# Patient Record
Sex: Female | Born: 1990 | Race: Black or African American | Hispanic: No | Marital: Single | Smoking: Never smoker
Health system: Southern US, Community
[De-identification: ages and names within clinical notes are randomized; demographics above are authoritative.]

## PROBLEM LIST (undated history)

## (undated) ENCOUNTER — Inpatient Hospital Stay (HOSPITAL_COMMUNITY): Payer: Self-pay

## (undated) DIAGNOSIS — Z8759 Personal history of other complications of pregnancy, childbirth and the puerperium: Secondary | ICD-10-CM

## (undated) DIAGNOSIS — A749 Chlamydial infection, unspecified: Secondary | ICD-10-CM

## (undated) HISTORY — PX: BREAST SURGERY: SHX581

## (undated) HISTORY — PX: TONSILLECTOMY: SUR1361

---
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
## 2010-08-19 ENCOUNTER — Encounter: Payer: Self-pay | Admitting: Family Medicine

## 2010-08-19 ENCOUNTER — Inpatient Hospital Stay (HOSPITAL_COMMUNITY)
Admission: AD | Admit: 2010-08-19 | Discharge: 2010-08-19 | Payer: Self-pay | Source: Home / Self Care | Admitting: Family Medicine

## 2010-08-19 ENCOUNTER — Ambulatory Visit: Payer: Self-pay | Admitting: Family

## 2010-08-21 ENCOUNTER — Inpatient Hospital Stay (HOSPITAL_COMMUNITY)
Admission: AD | Admit: 2010-08-21 | Discharge: 2010-08-21 | Payer: Self-pay | Source: Home / Self Care | Admitting: Family Medicine

## 2010-08-28 ENCOUNTER — Ambulatory Visit (HOSPITAL_COMMUNITY)
Admission: RE | Admit: 2010-08-28 | Discharge: 2010-08-28 | Payer: Self-pay | Source: Home / Self Care | Admitting: Obstetrics and Gynecology

## 2010-08-28 ENCOUNTER — Ambulatory Visit: Payer: Self-pay | Admitting: Nurse Practitioner

## 2010-12-03 ENCOUNTER — Inpatient Hospital Stay (HOSPITAL_COMMUNITY)
Admission: AD | Admit: 2010-12-03 | Discharge: 2010-12-03 | Disposition: A | Discharge status: Home / Self Care | Payer: Medicaid Other | Source: Ambulatory Visit | Attending: Obstetrics and Gynecology | Admitting: Obstetrics and Gynecology

## 2010-12-03 DIAGNOSIS — R109 Unspecified abdominal pain: Secondary | ICD-10-CM | POA: Insufficient documentation

## 2010-12-03 DIAGNOSIS — B9689 Other specified bacterial agents as the cause of diseases classified elsewhere: Secondary | ICD-10-CM | POA: Insufficient documentation

## 2010-12-03 DIAGNOSIS — A499 Bacterial infection, unspecified: Secondary | ICD-10-CM

## 2010-12-03 DIAGNOSIS — N760 Acute vaginitis: Secondary | ICD-10-CM | POA: Insufficient documentation

## 2010-12-03 LAB — URINALYSIS, ROUTINE W REFLEX MICROSCOPIC
Hgb urine dipstick: NEGATIVE
Ketones, ur: NEGATIVE mg/dL
Protein, ur: NEGATIVE mg/dL
Urobilinogen, UA: 0.2 mg/dL (ref 0.0–1.0)

## 2010-12-03 LAB — HCG, QUANTITATIVE, PREGNANCY: hCG, Beta Chain, Quant, S: <2 m[IU]/mL (ref <5)

## 2010-12-03 LAB — WET PREP, GENITAL: Yeast Wet Prep HPF POC: NONE SEEN

## 2011-01-10 LAB — URINALYSIS, ROUTINE W REFLEX MICROSCOPIC
Leukocytes, UA: NEGATIVE
Nitrite: NEGATIVE
Specific Gravity, Urine: 1.01 (ref 1.005–1.030)
pH: 6.5 (ref 5.0–8.0)

## 2011-01-10 LAB — URINE MICROSCOPIC-ADD ON

## 2011-01-10 LAB — HCG, QUANTITATIVE, PREGNANCY
hCG, Beta Chain, Quant, S: 110 m[IU]/mL — ABNORMAL HIGH (ref <5)
hCG, Beta Chain, Quant, S: 557 m[IU]/mL — ABNORMAL HIGH (ref <5)

## 2011-01-10 LAB — CBC
MCH: 31.6 pg (ref 26.0–34.0)
MCHC: 33.9 g/dL (ref 30.0–36.0)
MCV: 93.2 fL (ref 78.0–100.0)
Platelets: 240 10*3/uL (ref 150–400)

## 2011-01-10 LAB — WET PREP, GENITAL: WBC, Wet Prep HPF POC: NONE SEEN

## 2011-02-12 ENCOUNTER — Inpatient Hospital Stay (HOSPITAL_COMMUNITY)
Admission: AD | Admit: 2011-02-12 | Discharge: 2011-02-12 | Disposition: A | Discharge status: Home / Self Care | Payer: Medicaid Other | Source: Ambulatory Visit | Attending: Obstetrics & Gynecology | Admitting: Obstetrics & Gynecology

## 2011-02-12 DIAGNOSIS — N912 Amenorrhea, unspecified: Secondary | ICD-10-CM

## 2011-02-12 DIAGNOSIS — Z3202 Encounter for pregnancy test, result negative: Secondary | ICD-10-CM | POA: Insufficient documentation

## 2011-02-12 LAB — POCT PREGNANCY, URINE: Preg Test, Ur: NEGATIVE

## 2011-12-08 ENCOUNTER — Inpatient Hospital Stay (HOSPITAL_COMMUNITY)
Admission: AD | Admit: 2011-12-08 | Discharge: 2011-12-08 | Disposition: A | Discharge status: Home / Self Care | Payer: Self-pay | Source: Ambulatory Visit | Attending: Obstetrics & Gynecology | Admitting: Obstetrics & Gynecology

## 2011-12-08 ENCOUNTER — Encounter (HOSPITAL_COMMUNITY): Payer: Self-pay

## 2011-12-08 DIAGNOSIS — R109 Unspecified abdominal pain: Secondary | ICD-10-CM | POA: Insufficient documentation

## 2011-12-08 DIAGNOSIS — A499 Bacterial infection, unspecified: Secondary | ICD-10-CM | POA: Insufficient documentation

## 2011-12-08 DIAGNOSIS — Z3202 Encounter for pregnancy test, result negative: Secondary | ICD-10-CM | POA: Insufficient documentation

## 2011-12-08 DIAGNOSIS — B9689 Other specified bacterial agents as the cause of diseases classified elsewhere: Secondary | ICD-10-CM | POA: Insufficient documentation

## 2011-12-08 DIAGNOSIS — N760 Acute vaginitis: Secondary | ICD-10-CM | POA: Insufficient documentation

## 2011-12-08 LAB — CBC
HCT: 35.1 % — ABNORMAL LOW (ref 36.0–46.0)
MCHC: 33.6 g/dL (ref 30.0–36.0)
RDW: 11.9 % (ref 11.5–15.5)
WBC: 5 10*3/uL (ref 4.0–10.5)

## 2011-12-08 LAB — URINALYSIS, ROUTINE W REFLEX MICROSCOPIC
Bilirubin Urine: NEGATIVE
Glucose, UA: NEGATIVE mg/dL
Ketones, ur: NEGATIVE mg/dL
Leukocytes, UA: NEGATIVE
Nitrite: NEGATIVE
Protein, ur: NEGATIVE mg/dL

## 2011-12-08 LAB — WET PREP, GENITAL: Trich, Wet Prep: NONE SEEN

## 2011-12-08 MED ORDER — NAPROXEN SODIUM 550 MG PO TABS: 550.0000 mg | ORAL_TABLET | Freq: Two times a day (BID) | ORAL | Status: DC

## 2011-12-08 MED ORDER — METRONIDAZOLE 500 MG PO TABS: 500.0000 mg | ORAL_TABLET | Freq: Two times a day (BID) | ORAL | Status: AC

## 2011-12-08 NOTE — Progress Notes (Signed)
Pt states took 3 pregnancy tests yesterday, one positive, 2 negative. Here for bilateral lower abdominal pain, notes white discharge, hx BV. Denies bleeding.

## 2011-12-08 NOTE — Progress Notes (Signed)
Onset of abdominal pain since yesterday at work two negative and one positive UPT, pain is lower abdominal pain bilaterally , LMP 11/02/11 patient unsure if that was an actual period or a miscarriage.

## 2011-12-08 NOTE — ED Provider Notes (Signed)
History   Pt presents today wishing to have a preg test. She states she had bleeding for only 1 day last month and she was concerned that she could have an ectopic preg. She states she had one positive preg test at home followed by 2 negative home preg tests. She states she has been having some lower abd cramping with mild vag dc. She denies fever or any other sx.   Chief Complaint  Patient presents with  . Abdominal Pain   HPI  OB History    Grav Para Term Preterm Abortions TAB SAB Ect Mult Living   1    1  1          Past Medical History  Diagnosis Date  . No pertinent past medical history     Past Surgical History  Procedure Date  . Tonsillectomy     Family History  Problem Relation Age of Onset  . Anesthesia problems Neg Hx     History  Substance Use Topics  . Smoking status: Never Smoker   . Smokeless tobacco: Never Used  . Alcohol Use: 0.6 oz/week    1 Glasses of wine per week    Allergies: No Known Allergies  No prescriptions prior to admission    Review of Systems  Constitutional: Negative for fever and chills.  Eyes: Negative for blurred vision and double vision.  Respiratory: Negative for cough, hemoptysis, sputum production, shortness of breath and wheezing.   Cardiovascular: Negative for chest pain and palpitations.  Gastrointestinal: Positive for abdominal pain. Negative for nausea, vomiting, diarrhea and constipation.  Genitourinary: Negative for dysuria, urgency, frequency and hematuria.  Neurological: Negative for dizziness and headaches.  Psychiatric/Behavioral: Negative for depression and suicidal ideas.   Physical Exam   Blood pressure 109/68, pulse 81, temperature 98.6 F (37 C), temperature source Oral, resp. rate 16, height 5\' 6"  (1.676 m), weight 124 lb (56.246 kg), last menstrual period 11/02/2011.  Physical Exam  Nursing note and vitals reviewed. Constitutional: She is oriented to person, place, and time. She appears well-developed  and well-nourished. No distress.  HENT:  Head: Normocephalic and atraumatic.  Eyes: EOM are normal. Pupils are equal, round, and reactive to light.  GI: Soft. She exhibits no distension and no mass. There is no tenderness. There is no rebound and no guarding.  Genitourinary: No bleeding around the vagina. Vaginal discharge found.       Cervix Lg/closed. Uterus NL size and shape. No adnexal masses. Pt non-tender on exam.  Neurological: She is alert and oriented to person, place, and time.  Skin: Skin is warm and dry. She is not diaphoretic.  Psychiatric: She has a normal mood and affect. Her behavior is normal. Judgment and thought content normal.    MAU Course  Procedures  Wet prep and GC/Chlamydia cultures done.  Results for orders placed during the hospital encounter of 12/08/11 (from the past 24 hour(s))  URINALYSIS, ROUTINE W REFLEX MICROSCOPIC     Status: Abnormal   Collection Time   12/08/11 10:45 AM      Component Value Range   Color, Urine YELLOW  YELLOW    APPearance HAZY (*) CLEAR    Specific Gravity, Urine >1.030 (*) 1.005 - 1.030    pH 6.0  5.0 - 8.0    Glucose, UA NEGATIVE  NEGATIVE (mg/dL)   Hgb urine dipstick NEGATIVE  NEGATIVE    Bilirubin Urine NEGATIVE  NEGATIVE    Ketones, ur NEGATIVE  NEGATIVE (mg/dL)   Protein, ur  NEGATIVE  NEGATIVE (mg/dL)   Urobilinogen, UA 2.0 (*) 0.0 - 1.0 (mg/dL)   Nitrite NEGATIVE  NEGATIVE    Leukocytes, UA NEGATIVE  NEGATIVE   POCT PREGNANCY, URINE     Status: Normal   Collection Time   12/08/11 10:48 AM      Component Value Range   Preg Test, Ur NEGATIVE  NEGATIVE   CBC     Status: Abnormal   Collection Time   12/08/11 11:10 AM      Component Value Range   WBC 5.0  4.0 - 10.5 (K/uL)   RBC 3.84 (*) 3.87 - 5.11 (MIL/uL)   Hemoglobin 11.8 (*) 12.0 - 15.0 (g/dL)   HCT 16.1 (*) 09.6 - 46.0 (%)   MCV 91.4  78.0 - 100.0 (fL)   MCH 30.7  26.0 - 34.0 (pg)   MCHC 33.6  30.0 - 36.0 (g/dL)   RDW 04.5  40.9 - 81.1 (%)   Platelets 263  150  - 400 (K/uL)  WET PREP, GENITAL     Status: Abnormal   Collection Time   12/08/11 11:25 AM      Component Value Range   Yeast Wet Prep HPF POC NONE SEEN  NONE SEEN    Trich, Wet Prep NONE SEEN  NONE SEEN    Clue Cells Wet Prep HPF POC MANY (*) NONE SEEN    WBC, Wet Prep HPF POC FEW (*) NONE SEEN      Assessment and Plan  Abd pain: discussed with pt at length. No evidence of UTI, preg, or ovarian cyst. Will give Rx for anaprox ds. She will f/u with her PCP.  BV: discussed with pt at length. Will give Rx for Flagyl. Warned of antabuse reaction. Discussed diet, activity, risks, and precautions.  Clinton Gallant. Rayni Nemitz III, DrHSc, MPAS, PA-C  12/08/2011, 11:18 AM   Henrietta Hoover, PA 12/08/11 1151

## 2011-12-11 LAB — GC/CHLAMYDIA PROBE AMP, GENITAL
Chlamydia, DNA Probe: POSITIVE — AB
GC Probe Amp, Genital: NEGATIVE

## 2012-01-11 ENCOUNTER — Telehealth (HOSPITAL_COMMUNITY): Payer: Self-pay

## 2012-06-12 ENCOUNTER — Encounter (HOSPITAL_COMMUNITY): Payer: Self-pay | Admitting: *Deleted

## 2012-06-12 ENCOUNTER — Inpatient Hospital Stay (HOSPITAL_COMMUNITY)
Admission: AD | Admit: 2012-06-12 | Discharge: 2012-06-12 | Disposition: A | Discharge status: Home / Self Care | Payer: Medicaid Other | Source: Ambulatory Visit | Attending: Obstetrics & Gynecology | Admitting: Obstetrics & Gynecology

## 2012-06-12 ENCOUNTER — Inpatient Hospital Stay (HOSPITAL_COMMUNITY): Payer: Medicaid Other

## 2012-06-12 DIAGNOSIS — O99891 Other specified diseases and conditions complicating pregnancy: Secondary | ICD-10-CM | POA: Insufficient documentation

## 2012-06-12 DIAGNOSIS — R109 Unspecified abdominal pain: Secondary | ICD-10-CM | POA: Insufficient documentation

## 2012-06-12 DIAGNOSIS — Z3201 Encounter for pregnancy test, result positive: Secondary | ICD-10-CM

## 2012-06-12 HISTORY — DX: Personal history of other complications of pregnancy, childbirth and the puerperium: Z87.59

## 2012-06-12 LAB — CBC
Hemoglobin: 12 g/dL (ref 12.0–15.0)
MCH: 30.5 pg (ref 26.0–34.0)
MCHC: 33.8 g/dL (ref 30.0–36.0)
MCV: 90.3 fL (ref 78.0–100.0)
RBC: 3.93 MIL/uL (ref 3.87–5.11)

## 2012-06-12 LAB — WET PREP, GENITAL: Yeast Wet Prep HPF POC: NONE SEEN

## 2012-06-12 LAB — URINALYSIS, ROUTINE W REFLEX MICROSCOPIC
Bilirubin Urine: NEGATIVE
Glucose, UA: NEGATIVE mg/dL
Ketones, ur: NEGATIVE mg/dL
Leukocytes, UA: NEGATIVE
Nitrite: NEGATIVE
Protein, ur: NEGATIVE mg/dL
pH: 6 (ref 5.0–8.0)

## 2012-06-12 NOTE — MAU Provider Note (Signed)
History     CSN: [id number]  Arrival date and time: 06/12/12 1453   None     No chief complaint on file.  HPI Patient is a G3P0020 at 5.4 EGA who presents for low abdominal cramping and pregnancy test.  States this started one week ago.  Lasts for 10 minutes and comes out of the blue.  Denies bleeding and discharge. No nausea or vomiting.  States she has had some issues with constipation where she only has a BM every few days.  No changes in urination.  OB History    Grav Para Term Preterm Abortions TAB SAB Ect Mult Living   3    2  2          Past Medical History  Diagnosis Date  . History of miscarriage     Past Surgical History  Procedure Date  . Tonsillectomy     Family History  Problem Relation Age of Onset  . Anesthesia problems Neg Hx   . Other Neg Hx     History  Substance Use Topics  . Smoking status: Never Smoker   . Smokeless tobacco: Never Used  . Alcohol Use: No    Allergies: No Known Allergies  Prescriptions prior to admission  Medication Sig Dispense Refill  . ibuprofen (ADVIL,MOTRIN) 200 MG tablet Take 400-600 mg by mouth every 6 (six) hours as needed. For pain      . naproxen sodium (ANAPROX DS) 550 MG tablet Take 1 tablet (550 mg total) by mouth 2 (two) times daily with a meal.  30 tablet  0    Review of Systems  Constitutional: Negative for fever and chills.  Neurological: Positive for headaches.   Physical Exam   Blood pressure 116/86, pulse 82, temperature 98.1 F (36.7 C), temperature source Oral, resp. rate 16, height 5\' 6"  (1.676 m), weight 61.916 kg (136 lb 8 oz), last menstrual period 05/04/2012.  Physical Exam  Constitutional: She is oriented to person, place, and time. She appears well-developed and well-nourished.  HENT:  Head: Normocephalic and atraumatic.  Cardiovascular: Normal rate, regular rhythm and normal heart sounds.   Respiratory: Effort normal and breath sounds normal.  GI: Soft. There is no tenderness. There is  no rebound and no guarding.  Genitourinary:       Cervix: closed, non-tender on bimanual exam Speculum: minimal white discharge present  Musculoskeletal: She exhibits no edema.  Neurological: She is alert and oriented to person, place, and time.  Skin: Skin is warm and dry.  Psychiatric: She has a normal mood and affect.   Results for orders placed during the hospital encounter of 06/12/12 (from the past 24 hour(s))  URINALYSIS, ROUTINE W REFLEX MICROSCOPIC     Status: Abnormal   Collection Time   06/12/12  3:31 PM      Component Value Range   Color, Urine YELLOW  YELLOW   APPearance CLEAR  CLEAR   Specific Gravity, Urine >1.030 (*) 1.005 - 1.030   pH 6.0  5.0 - 8.0   Glucose, UA NEGATIVE  NEGATIVE mg/dL   Hgb urine dipstick NEGATIVE  NEGATIVE   Bilirubin Urine NEGATIVE  NEGATIVE   Ketones, ur NEGATIVE  NEGATIVE mg/dL   Protein, ur NEGATIVE  NEGATIVE mg/dL   Urobilinogen, UA 1.0  0.0 - 1.0 mg/dL   Nitrite NEGATIVE  NEGATIVE   Leukocytes, UA NEGATIVE  NEGATIVE  POCT PREGNANCY, URINE     Status: Abnormal   Collection Time   06/12/12  3:54 PM  Component Value Range   Preg Test, Ur POSITIVE (*) NEGATIVE  CBC     Status: Abnormal   Collection Time   06/12/12  4:00 PM      Component Value Range   WBC 5.7  4.0 - 10.5 K/uL   RBC 3.93  3.87 - 5.11 MIL/uL   Hemoglobin 12.0  12.0 - 15.0 g/dL   HCT 40.9 (*) 81.1 - 91.4 %   MCV 90.3  78.0 - 100.0 fL   MCH 30.5  26.0 - 34.0 pg   MCHC 33.8  30.0 - 36.0 g/dL   RDW 78.2  95.6 - 21.3 %   Platelets 246  150 - 400 K/uL  ABO/RH     Status: Normal   Collection Time   06/12/12  5:25 PM      Component Value Range   ABO/RH(D) A POS    HCG, QUANTITATIVE, PREGNANCY     Status: Abnormal   Collection Time   06/12/12  5:25 PM      Component Value Range   hCG, Beta Chain, Quant, S 1994 (*) <5 mIU/mL  WET PREP, GENITAL     Status: Abnormal   Collection Time   06/12/12  5:35 PM      Component Value Range   Yeast Wet Prep HPF POC NONE SEEN   NONE SEEN   Trich, Wet Prep NONE SEEN  NONE SEEN   Clue Cells Wet Prep HPF POC FEW (*) NONE SEEN   WBC, Wet Prep HPF POC FEW (*) NONE SEEN    MAU Course  Procedures  [place]: single intrauterine gestational sac seen, no yolk sac or fetal pole, though not expected at this EGA  Assessment and Plan  Patient is a G3P0020 at 5.4 EGA who presents for lower abdominal cramping.  IUGS identified on [place] with positive pregnancy test.  Will follow with a second hCG in two days to confirm progression of normal pregnancy. GC/Chlamydia collected and pending. Advised patient that she should try a fiber supplement for her constipation. Patient stated needed to leave prior to all test results coming back.  Gave nurse a phone number to call if any of her tests came back positive.  Patient states she will return in two days for follow-up blood work.  Marikay Alar 06/12/2012, 5:39 PM   I saw and examined patient along with student and agree with above note.

## 2012-06-12 NOTE — MAU Note (Signed)
Pt states LMP-05/04/2012, notes intermittent cramping like menstrual cycle is going to start. 3 prior miscarriages. Denies vaginal bleeding, notes white vaginal discharge, non-odorous. No pain at present. Did feel cramping in lobby waiting area. States breasts are also swollen.

## 2012-06-13 ENCOUNTER — Telehealth: Payer: Self-pay | Admitting: Advanced Practice Midwife

## 2012-06-13 LAB — GC/CHLAMYDIA PROBE AMP, GENITAL: Chlamydia, DNA Probe: NEGATIVE

## 2012-06-13 NOTE — Telephone Encounter (Signed)
Called requesting HCG results from MAU 06/12/12. States level was 181 06/07/12 at a different hospital. Advised that this is an appropriate rise, but it is still necessary to have HCG checked tomorrow in MAU (same lab). Bleeding and pain precautions.

## 2012-06-14 ENCOUNTER — Inpatient Hospital Stay (HOSPITAL_COMMUNITY)
Admission: AD | Admit: 2012-06-14 | Discharge: 2012-06-14 | Disposition: A | Discharge status: Home / Self Care | Payer: Medicaid Other | Source: Ambulatory Visit | Attending: Obstetrics & Gynecology | Admitting: Obstetrics & Gynecology

## 2012-06-14 ENCOUNTER — Encounter (HOSPITAL_COMMUNITY): Payer: Self-pay | Admitting: *Deleted

## 2012-06-14 DIAGNOSIS — O99891 Other specified diseases and conditions complicating pregnancy: Secondary | ICD-10-CM | POA: Insufficient documentation

## 2012-06-14 DIAGNOSIS — R109 Unspecified abdominal pain: Secondary | ICD-10-CM | POA: Insufficient documentation

## 2012-06-14 DIAGNOSIS — O26899 Other specified pregnancy related conditions, unspecified trimester: Secondary | ICD-10-CM

## 2012-06-14 LAB — HCG, QUANTITATIVE, PREGNANCY: hCG, Beta Chain, Quant, S: 4362 m[IU]/mL — ABNORMAL HIGH (ref <5)

## 2012-06-14 MED ORDER — PRENATAL MULTIVITAMIN CH: 1.0000 | ORAL_TABLET | Freq: Every day | ORAL | Status: DC

## 2012-06-14 NOTE — MAU Note (Signed)
Pt states, " I'm still cramping but not as much and I feel gasy too. I am not having any bleeding. I am suppossed to have my hormone level checked today."

## 2012-06-14 NOTE — MAU Provider Note (Signed)
Rachael C Smith20 y.o.G3P0020 @[redacted]w[redacted]d  by LMP Chief Complaint  Patient presents with  . Follow-up    Contact initiated at 2300     SUBJECTIVE  HPI: Here for F/U quant after being seen 06/12/12 @[redacted]w[redacted]d  LMP for lower abd cramping which has resolved. No vaginal bleeding. [place] 2 days ago showed IUGSc/w [redacted]w[redacted]d, no YS or embryo, normal adnexae.   Past Medical History  Diagnosis Date  . History of miscarriage    Past Surgical History  Procedure Date  . Tonsillectomy    History   Social History  . Marital Status: Single    Spouse Name: N/A    Number of Children: N/A  . Years of Education: N/A   Occupational History  . Not on file.   Social History Main Topics  . Smoking status: Never Smoker   . Smokeless tobacco: Never Used  . Alcohol Use: No  . Drug Use: No  . Sexually Active: Yes    Birth Control/ Protection: None   Other Topics Concern  . Not on file   Social History Narrative  . No narrative on file   No current facility-administered medications on file prior to encounter.   Current Outpatient Prescriptions on File Prior to Encounter  Medication Sig Dispense Refill  . Prenatal Vit-Fe Fumarate-FA (PRENATAL MULTIVITAMIN) TABS Take 1 tablet by mouth daily.       No Known Allergies  ROS: Pertinent items in HPI  OBJECTIVE Blood pressure 117/47, pulse 77, temperature 99.1 F (37.3 C), temperature source Oral, resp. rate 16, height 5\' 6"  (1.676 m), weight 62.313 kg (137 lb 6 oz), last menstrual period 05/04/2012.  GENERAL: Well-developed, well-nourished female in no acute distress.  HEENT: Normocephalic, good dentition ABDOMEN: Soft, nontender EXTREMITIES: Nontender, no edema NEURO: Alert and oriented LAB RESULTS  Results for orders placed during the hospital encounter of 06/14/12 (from the past 24 hour(s))  HCG, QUANTITATIVE, PREGNANCY     Status: Abnormal   Collection Time   06/14/12  9:38 PM      Component Value Range   hCG, Beta Chain, Quant, S 4362 (*) <5  mIU/mL    IMAGING   ASSESSMENT  1. Abdominal pain in pregnancy   G3P0020 @[redacted]w[redacted]d  pregnancy with appropriate rise in bHCG and resolution of abd cramps  PLAN  Medication List  As of 06/14/2012 11:10 PM   ASK your doctor about these medications         prenatal multivitamin Tabs   Take 1 tablet by mouth daily.           Will call [facility] next week for OB appointment     Rachael Mann 06/14/2012 11:10 PM

## 2012-06-17 ENCOUNTER — Encounter (HOSPITAL_COMMUNITY): Payer: Self-pay | Admitting: *Deleted

## 2012-06-17 ENCOUNTER — Inpatient Hospital Stay (HOSPITAL_COMMUNITY)
Admission: AD | Admit: 2012-06-17 | Discharge: 2012-06-17 | Disposition: A | Discharge status: Home / Self Care | Payer: Medicaid Other | Source: Ambulatory Visit | Attending: Obstetrics & Gynecology | Admitting: Obstetrics & Gynecology

## 2012-06-17 DIAGNOSIS — R143 Flatulence: Secondary | ICD-10-CM

## 2012-06-17 DIAGNOSIS — Z331 Pregnant state, incidental: Secondary | ICD-10-CM

## 2012-06-17 DIAGNOSIS — K5900 Constipation, unspecified: Secondary | ICD-10-CM

## 2012-06-17 DIAGNOSIS — R142 Eructation: Secondary | ICD-10-CM | POA: Insufficient documentation

## 2012-06-17 DIAGNOSIS — R141 Gas pain: Secondary | ICD-10-CM | POA: Insufficient documentation

## 2012-06-17 DIAGNOSIS — R1012 Left upper quadrant pain: Secondary | ICD-10-CM | POA: Insufficient documentation

## 2012-06-17 DIAGNOSIS — O99891 Other specified diseases and conditions complicating pregnancy: Secondary | ICD-10-CM | POA: Insufficient documentation

## 2012-06-17 LAB — URINALYSIS, ROUTINE W REFLEX MICROSCOPIC
Bilirubin Urine: NEGATIVE
Hgb urine dipstick: NEGATIVE
Ketones, ur: NEGATIVE mg/dL
Nitrite: NEGATIVE
Protein, ur: NEGATIVE mg/dL
Urobilinogen, UA: 0.2 mg/dL (ref 0.0–1.0)

## 2012-06-17 NOTE — MAU Note (Signed)
Pt asking to go home since she can't have [place] she requested.

## 2012-06-17 NOTE — MAU Note (Signed)
SAYS SHE WAS HERE ON  LAST Thursday - FOR CRAMPS,  THEN ON SAT  FOR LABS.  HAS RETURNED  TONIGHT  FOR UPPER ABD PAIN ON L- SHE ATE- AND PAIN WENT AWAY X10 MIN.  PAIN IS NOW SAME AS WAS AT HOME.  NO N/V/D-  FEELS LOTS GAS.-  LAST BM- YESTERDAY- NL.  NONE TODAY..  DENIES VAG BLEEDING

## 2012-06-17 NOTE — MAU Provider Note (Addendum)
History     CSN: [id number]  Arrival date and time: 06/17/12 2131   First Provider Initiated Contact with Patient 06/17/12 2235      Chief Complaint  Patient presents with  . Abdominal Pain   HPI Pt reports sharp LUQ pain starting last night. Intermittent throughout the day today. No radiation. Eating seems to help but pain returns. Twisting or bending torso makes it worse. Pt also has pelvic cramping but this is unchanged from prior visit. No vaginal bleeding. No dysuria. No fever/chills. No nausea/vomiting/diarrhea. Has been mildly constipated and has changed her diet to include more roughage. Noticed increased gas - per mouth and rectum. Rates pain 3/10 now, 5/10 at worst. Worried about miscarriage since she has had two prior early miscarriages.   OB History    Grav Para Term Preterm Abortions TAB SAB Ect Mult Living   3    2  2          Past Medical History  Diagnosis Date  . History of miscarriage     Past Surgical History  Procedure Date  . Tonsillectomy     Family History  Problem Relation Age of Onset  . Anesthesia problems Neg Hx   . Other Neg Hx   . Diabetes Maternal Grandmother     History  Substance Use Topics  . Smoking status: Never Smoker   . Smokeless tobacco: Never Used  . Alcohol Use: No    Allergies: No Known Allergies  Prescriptions prior to admission  Medication Sig Dispense Refill  . Prenatal Vit-Fe Fumarate-FA (PRENATAL MULTIVITAMIN) TABS Take 1 tablet by mouth daily.  30 tablet  5    Review of Systems  Constitutional: Negative for fever and chills.  Eyes: Negative.   Respiratory: Negative for cough and shortness of breath.   Cardiovascular: Negative for chest pain and palpitations.  Gastrointestinal: Positive for abdominal pain and constipation. Negative for heartburn, nausea, vomiting and diarrhea.  Genitourinary: Negative for dysuria and urgency.  Musculoskeletal: Negative for myalgias and back pain.  Neurological: Positive for  dizziness. Negative for headaches.   Physical Exam   Blood pressure 105/64, pulse 81, temperature 98.2 F (36.8 C), temperature source Oral, resp. rate 18, height 5\' 5"  (1.651 m), weight 62.71 kg (138 lb 4 oz), last menstrual period 05/04/2012, SpO2 99.00%.  Physical Exam  Constitutional: She is oriented to person, place, and time. She appears well-developed and well-nourished. No distress.  HENT:  Head: Normocephalic and atraumatic.  Eyes: Conjunctivae and EOM are normal.  Neck: Normal range of motion. Neck supple.  Cardiovascular: Normal rate, regular rhythm and normal heart sounds.   Respiratory: Breath sounds normal.  GI: Soft. Bowel sounds are normal. She exhibits no distension. There is tenderness (LUQ and left flank/CVA, mild). There is no rebound.  Musculoskeletal: She exhibits no edema and no tenderness.  Neurological: She is alert and oriented to person, place, and time.  Skin: Skin is warm and dry.  Psychiatric: She has a normal mood and affect.    MAU Course  Procedures  UA pending.   Assessment and Plan  21 y.o. G3P0020 at [redacted]w[redacted]d with LUQ/flank pain and pelvic cramping.  1.  Pregnancy - 4 wk sono showed intrauterine pregnancy with yolk sack. No signs of miscarriage. Cramping unchanged, no bleeding. 2.  LUQ/flank pain - check UA to rule out kidney issue but very unlikely given presentation. Likely GI related.   Dispo:  Signed out to [place], CNM at 11:00 PM 06/17/12.  Napoleon Form,  MD 06/17/2012, 11:00 PM   [place] in care of patient at 2300. Patient in no apparent distress.  Results for orders placed during the hospital encounter of 06/17/12 (from the past 24 hour(s))  URINALYSIS, ROUTINE W REFLEX MICROSCOPIC     Status: Abnormal   Collection Time   06/17/12  9:57 PM      Component Value Range   Color, Urine YELLOW  YELLOW   APPearance CLEAR  CLEAR   Specific Gravity, Urine >1.030 (*) 1.005 - 1.030   pH 5.5  5.0 - 8.0   Glucose, UA NEGATIVE  NEGATIVE mg/dL     Hgb urine dipstick NEGATIVE  NEGATIVE   Bilirubin Urine NEGATIVE  NEGATIVE   Ketones, ur NEGATIVE  NEGATIVE mg/dL   Protein, ur NEGATIVE  NEGATIVE mg/dL   Urobilinogen, UA 0.2  0.0 - 1.0 mg/dL   Nitrite NEGATIVE  NEGATIVE   Leukocytes, UA NEGATIVE  NEGATIVE   Assessment 1. Abdominal gas pain   2. Pregnancy, incidental   3. Constipation    Plan Discharge home Urine culture pending Reassurance given Discussed diet and medications for constipation and gas pain Medication List  As of 06/18/2012 12:10 AM   CONTINUE taking these medications         prenatal multivitamin Tabs   Take 1 tablet by mouth daily.           Follow-up Information    Follow up with [facility].      Follow up with [facility]. (As needed if symptoms worsen)    Contact information:   [address] [place] [address] [phone]        Tniya Bowditch, [place] 06/18/2012 12:10 AM

## 2012-06-18 NOTE — MAU Provider Note (Signed)
Attestation of Attending Supervision of Advanced Practitioner (CNM/NP): Evaluation and management procedures were performed by the Advanced Practitioner under my supervision and collaboration.  I have reviewed the Advanced Practitioner's note and chart, and I agree with the management and plan.  Domonique Brouillard, MD, FACOG Attending Obstetrician & Gynecologist Faculty Practice, [facility]  

## 2012-07-01 ENCOUNTER — Inpatient Hospital Stay (HOSPITAL_COMMUNITY): Payer: Medicaid Other

## 2012-07-01 ENCOUNTER — Encounter (HOSPITAL_COMMUNITY): Payer: Self-pay

## 2012-07-01 ENCOUNTER — Inpatient Hospital Stay (HOSPITAL_COMMUNITY)
Admission: AD | Admit: 2012-07-01 | Discharge: 2012-07-01 | Disposition: A | Discharge status: Home / Self Care | Payer: Medicaid Other | Source: Ambulatory Visit | Attending: Obstetrics & Gynecology | Admitting: Obstetrics & Gynecology

## 2012-07-01 DIAGNOSIS — Z3490 Encounter for supervision of normal pregnancy, unspecified, unspecified trimester: Secondary | ICD-10-CM

## 2012-07-01 DIAGNOSIS — A499 Bacterial infection, unspecified: Secondary | ICD-10-CM | POA: Insufficient documentation

## 2012-07-01 DIAGNOSIS — N760 Acute vaginitis: Secondary | ICD-10-CM | POA: Insufficient documentation

## 2012-07-01 DIAGNOSIS — B9689 Other specified bacterial agents as the cause of diseases classified elsewhere: Secondary | ICD-10-CM | POA: Insufficient documentation

## 2012-07-01 DIAGNOSIS — O209 Hemorrhage in early pregnancy, unspecified: Secondary | ICD-10-CM | POA: Insufficient documentation

## 2012-07-01 DIAGNOSIS — O2390 Unspecified genitourinary tract infection in pregnancy, unspecified trimester: Secondary | ICD-10-CM | POA: Insufficient documentation

## 2012-07-01 DIAGNOSIS — O418X90 Other specified disorders of amniotic fluid and membranes, unspecified trimester, not applicable or unspecified: Secondary | ICD-10-CM

## 2012-07-01 LAB — URINALYSIS, ROUTINE W REFLEX MICROSCOPIC
Bilirubin Urine: NEGATIVE
Glucose, UA: NEGATIVE mg/dL
Hgb urine dipstick: NEGATIVE
Ketones, ur: NEGATIVE mg/dL
Protein, ur: NEGATIVE mg/dL

## 2012-07-01 LAB — WET PREP, GENITAL: Yeast Wet Prep HPF POC: NONE SEEN

## 2012-07-01 MED ORDER — METRONIDAZOLE 500 MG PO TABS: 500.0000 mg | ORAL_TABLET | Freq: Two times a day (BID) | ORAL | Status: AC

## 2012-07-01 MED ORDER — PROMETHAZINE HCL 12.5 MG PO TABS: 12.5000 mg | ORAL_TABLET | Freq: Four times a day (QID) | ORAL | Status: DC | PRN

## 2012-07-01 NOTE — MAU Note (Signed)
Pt states spotting began last pm, this am noted more blood, however has since ceased. Cramping began this am, pt points to umbilicus. Also states when lying flat, feels like it is hard to breathe. Feels like piercing pain. Last intercourse last pm, blood noted post intercourse.

## 2012-07-01 NOTE — MAU Note (Signed)
Patient is in with c/o vaginal bleeding (which have stopped about ago per patient) and abdominal  Cramping. She had intercourse yesterday.

## 2012-07-01 NOTE — MAU Provider Note (Signed)
Attestation of Attending Supervision of Advanced Practitioner (CNM/NP): Evaluation and management procedures were performed by the Advanced Practitioner under my supervision and collaboration.  I have reviewed the Advanced Practitioner's note and chart, and I agree with the management and plan.  HARRAWAY-Rosier, Damany Eastman 6:34 PM     

## 2012-07-01 NOTE — MAU Provider Note (Signed)
History     CSN: [id number]  Arrival date and time: 07/01/12 1256   First Provider Initiated Contact with Patient 07/01/12 1404      No chief complaint on file.  HPI Rachael Mann is 21 y.o. G3P0020 [redacted]w[redacted]d weeks presenting with bleeding this morning after having intercourse last night.  Describes as dark red with a blood clot.  Was seen several weeks and had + YS on ultrasound.  States cramping has gotten worse since last visit.  She is concerned because she has hx of 2 miscarriage.   Past Medical History  Diagnosis Date  . History of miscarriage     Past Surgical History  Procedure Date  . Tonsillectomy     Family History  Problem Relation Age of Onset  . Anesthesia problems Neg Hx   . Other Neg Hx   . Diabetes Maternal Grandmother     History  Substance Use Topics  . Smoking status: Never Smoker   . Smokeless tobacco: Never Used  . Alcohol Use: No    Allergies: No Known Allergies  Prescriptions prior to admission  Medication Sig Dispense Refill  . Prenatal Vit-Fe Fumarate-FA (PRENATAL MULTIVITAMIN) TABS Take 1 tablet by mouth daily.  30 tablet  5    Review of Systems  Constitutional: Negative.   HENT: Negative.   Respiratory: Negative.   Cardiovascular: Negative.   Gastrointestinal: Positive for abdominal pain (cramping). Negative for nausea and vomiting.  Genitourinary:       + dark vaginal bleeding with 1 clot   Physical Exam   Blood pressure 109/54, pulse 85, temperature 98.2 F (36.8 C), temperature source Oral, resp. rate 16, height 5\' 5"  (1.651 m), weight 61.859 kg (136 lb 6 oz), last menstrual period 05/04/2012.  Physical Exam  Constitutional: She is oriented to person, place, and time. She appears well-developed and well-nourished. No distress.  HENT:  Head: Normocephalic.  Neck: Normal range of motion.  Cardiovascular: Normal rate.   Respiratory: Effort normal.  GI: Soft. She exhibits no distension and no mass. There is no tenderness.  There is no rebound and no guarding.  Genitourinary: Uterus is enlarged. Uterus is not tender. Cervix exhibits no motion tenderness, no discharge and no friability. Right adnexum displays no mass, no tenderness and no fullness. Left adnexum displays no mass, no tenderness and no fullness. No bleeding around the vagina. Vaginal discharge (white frothy discharge without odor) found.  Neurological: She is alert and oriented to person, place, and time.   Results for orders placed during the hospital encounter of 07/01/12 (from the past 24 hour(s))  URINALYSIS, ROUTINE W REFLEX MICROSCOPIC     Status: Normal   Collection Time   07/01/12  1:44 PM      Component Value Range   Color, Urine YELLOW  YELLOW   APPearance CLEAR  CLEAR   Specific Gravity, Urine 1.010  1.005 - 1.030   pH 6.5  5.0 - 8.0   Glucose, UA NEGATIVE  NEGATIVE mg/dL   Hgb urine dipstick NEGATIVE  NEGATIVE   Bilirubin Urine NEGATIVE  NEGATIVE   Ketones, ur NEGATIVE  NEGATIVE mg/dL   Protein, ur NEGATIVE  NEGATIVE mg/dL   Urobilinogen, UA 0.2  0.0 - 1.0 mg/dL   Nitrite NEGATIVE  NEGATIVE   Leukocytes, UA NEGATIVE  NEGATIVE  WET PREP, GENITAL     Status: Abnormal   Collection Time   07/01/12  2:15 PM      Component Value Range   Yeast Wet Prep  HPF POC NONE SEEN  NONE SEEN   Trich, Wet Prep NONE SEEN  NONE SEEN   Clue Cells Wet Prep HPF POC MODERATE (*) NONE SEEN   WBC, Wet Prep HPF POC FEW (*) NONE SEEN    MAU Course  Procedures  MDM 16:30. As Massie Bougie, RN was discharging the patient, she asked for a Rx for nausea.  She reports that she did not mention nausea to me.  I will have Rx for Phenergan eRx to her pharmacy.  Assessment and Plan  A:  Vaginal bleeding in early pregnancy      Viable IUP at [redacted]w[redacted]d gestation with small subchorionic hemorrhage      Bacterial vaginosis   P:  Rx for Flagyl 500mg  bid X 1 week     Pelvic rest until bleeding stops     Continue plans for prenatal care when Medicaid approved  Makenlee Mckeag,EVE  M 07/01/2012, 2:05 PM

## 2012-08-01 ENCOUNTER — Inpatient Hospital Stay (HOSPITAL_COMMUNITY)
Admission: AD | Admit: 2012-08-01 | Discharge: 2012-08-01 | Disposition: A | Discharge status: Home / Self Care | Payer: Medicaid Other | Source: Ambulatory Visit | Attending: Obstetrics & Gynecology | Admitting: Obstetrics & Gynecology

## 2012-08-01 ENCOUNTER — Encounter (HOSPITAL_COMMUNITY): Payer: Self-pay | Admitting: Obstetrics and Gynecology

## 2012-08-01 DIAGNOSIS — O26899 Other specified pregnancy related conditions, unspecified trimester: Secondary | ICD-10-CM

## 2012-08-01 DIAGNOSIS — O99891 Other specified diseases and conditions complicating pregnancy: Secondary | ICD-10-CM | POA: Insufficient documentation

## 2012-08-01 DIAGNOSIS — R51 Headache: Secondary | ICD-10-CM | POA: Insufficient documentation

## 2012-08-01 LAB — URINE MICROSCOPIC-ADD ON

## 2012-08-01 LAB — URINALYSIS, ROUTINE W REFLEX MICROSCOPIC
Bilirubin Urine: NEGATIVE
Glucose, UA: NEGATIVE mg/dL
Hgb urine dipstick: NEGATIVE
Specific Gravity, Urine: 1.02 (ref 1.005–1.030)
Urobilinogen, UA: 4 mg/dL — ABNORMAL HIGH (ref 0.0–1.0)

## 2012-08-01 MED ORDER — BUTALBITAL-APAP-CAFFEINE 50-325-40 MG PO TABS
1.0000 | ORAL_TABLET | ORAL | Status: DC | PRN
  Administered 2012-08-01: 1 via ORAL
  Filled 2012-08-01: qty 1

## 2012-08-01 MED ORDER — BUTALBITAL-APAP-CAFFEINE 50-325-40 MG PO TABS: 1.0000 | ORAL_TABLET | ORAL | Status: DC | PRN

## 2012-08-01 NOTE — MAU Provider Note (Signed)
History     CSN: [id number]  Arrival date and time: 08/01/12 1042   First Provider Initiated Contact with Patient 08/01/12 1341      Chief Complaint  Patient presents with  . Headache   HPI  Presented to the ED for a headaches that started at the beginning of her pregnancy, but since yesterday morning it has been worse, but currently she rates a 5/10. It gets worse sitting down. Standing up makes her nauseated. Laying down she goes to sleep, but when she wakes up her headache is worse. She states she never use to get headaches. She has not taken any medications for it. She points to midline rontal bone when asked location. Endorsed photophobia and phonophobia. Denies nausea, vomit or dizziness when having headache. No congestion or allergies. Denies burning, irritation with urination. Denies fevers. Denies leaking, gush of fluids, bleeding or cramping.   Past Medical History  Diagnosis Date  . History of miscarriage     Past Surgical History  Procedure Date  . Tonsillectomy     Family History  Problem Relation Age of Onset  . Anesthesia problems Neg Hx   . Other Neg Hx   . Diabetes Maternal Grandmother     History  Substance Use Topics  . Smoking status: Never Smoker   . Smokeless tobacco: Never Used  . Alcohol Use: No    Allergies: No Known Allergies  Prescriptions prior to admission  Medication Sig Dispense Refill  . Prenatal Vit-Fe Fumarate-FA (PRENATAL MULTIVITAMIN) TABS Take 1 tablet by mouth daily.  30 tablet  5  . promethazine (PHENERGAN) 12.5 MG tablet Take 1 tablet (12.5 mg total) by mouth every 6 (six) hours as needed for nausea.  30 tablet  0    Review of Systems  All other systems reviewed and are negative.   Physical Exam   Blood pressure 110/81, pulse 86, temperature 98.1 F (36.7 C), temperature source Oral, resp. rate 16, height 5\' 5"  (1.651 m), weight 61.236 kg (135 lb), last menstrual period 05/04/2012.  Physical Exam  Constitutional: She  is oriented to person, place, and time. She appears well-developed and well-nourished. No distress.  HENT:  Head: Normocephalic and atraumatic.  Nose: Nose normal.  Mouth/Throat: No oropharyngeal exudate.  Eyes: Conjunctivae normal and EOM are normal. Pupils are equal, round, and reactive to light. Right eye exhibits no discharge. Left eye exhibits no discharge. No scleral icterus.  Neck: Normal range of motion. Neck supple.  Cardiovascular: Normal rate, regular rhythm and normal heart sounds.  Exam reveals no gallop and no friction rub.   No murmur heard. Respiratory: Effort normal and breath sounds normal. No respiratory distress. She has no wheezes. She has no rales.  GI: Soft. Bowel sounds are normal. She exhibits no distension and no mass. There is no tenderness. There is no rebound and no guarding.       Gravid, uterus 3 finger breaths below umbilicus   Musculoskeletal: Normal range of motion. She exhibits no edema and no tenderness.       Muscle strength 5/5 UE/LE bilaterally  Lymphadenopathy:    She has no cervical adenopathy.  Neurological: She is alert and oriented to person, place, and time. She has normal reflexes. No cranial nerve deficit.  Skin: Skin is warm and dry. No rash noted. She is not diaphoretic. No pallor.  Psychiatric: She has a normal mood and affect.     MAU Course  Procedures 1. UA 2. Neuro exam  Assessment and Plan  1. Headache with pregnancy: Fioricet prescription 2. Discharge home 3. Keep scheduled appt on Tuesday  Felix Pacini 08/01/2012, 1:43 PM   I was present for the exam and agree with above.  [place], CNM 08/02/2012 8:06 AM

## 2012-08-01 NOTE — MAU Note (Signed)
Headaches, yesterday woke up with a  headache. Has been having headaches, wondered if there was blood work or something that could be done, to find out what meds she can take.  Has taken some tylenol, didn't really help.;

## 2012-08-05 ENCOUNTER — Other Ambulatory Visit (HOSPITAL_COMMUNITY): Payer: Self-pay | Admitting: Obstetrics and Gynecology

## 2012-08-05 ENCOUNTER — Other Ambulatory Visit: Payer: Self-pay

## 2012-08-05 DIAGNOSIS — Z3682 Encounter for antenatal screening for nuchal translucency: Secondary | ICD-10-CM

## 2012-08-05 LAB — OB RESULTS CONSOLE RPR: RPR: NONREACTIVE

## 2012-08-05 LAB — OB RESULTS CONSOLE ANTIBODY SCREEN: Antibody Screen: NEGATIVE

## 2012-08-08 ENCOUNTER — Ambulatory Visit (HOSPITAL_COMMUNITY)
Admission: RE | Admit: 2012-08-08 | Discharge: 2012-08-08 | Disposition: A | Discharge status: Home / Self Care | Payer: Medicaid Other | Source: Ambulatory Visit | Attending: Obstetrics and Gynecology | Admitting: Obstetrics and Gynecology

## 2012-08-08 ENCOUNTER — Encounter (HOSPITAL_COMMUNITY): Payer: Self-pay

## 2012-08-08 ENCOUNTER — Other Ambulatory Visit: Payer: Self-pay

## 2012-08-08 DIAGNOSIS — Z3689 Encounter for other specified antenatal screening: Secondary | ICD-10-CM | POA: Insufficient documentation

## 2012-08-08 DIAGNOSIS — Z3682 Encounter for antenatal screening for nuchal translucency: Secondary | ICD-10-CM

## 2012-08-08 DIAGNOSIS — O3510X0 Maternal care for (suspected) chromosomal abnormality in fetus, unspecified, not applicable or unspecified: Secondary | ICD-10-CM | POA: Insufficient documentation

## 2012-08-08 DIAGNOSIS — O351XX0 Maternal care for (suspected) chromosomal abnormality in fetus, not applicable or unspecified: Secondary | ICD-10-CM | POA: Insufficient documentation

## 2012-08-08 NOTE — Progress Notes (Signed)
Rachael Mann  was seen today for an ultrasound appointment.  See full report in [facility].  Alpha Gula, MD  Single IUP at 12 6/7 weeks NT of 2.0 mm noted; nasal bone visualized First trimester aneuploidy screen performed as noted above.  Please do not draw triple/quad screen, though patient should be offered MSAFP for neural tube defect screening.   Ultrasound for fetal anatomic evaluation at 18-[redacted] wks GA.   Please contact our clinic if you would like to have this procedure performed here.

## 2012-08-13 NOTE — MAU Provider Note (Signed)
Attestation of Attending Supervision of Advanced Practitioner (CNM/NP): Evaluation and management procedures were performed by the Advanced Practitioner under my supervision and collaboration.  I have reviewed the Advanced Practitioner's note and chart, and I agree with the management and plan.  HARRAWAY-Snoke, Hezikiah Retzloff 3:05 PM     

## 2012-10-29 NOTE — L&D Delivery Note (Signed)
Patient was C/C/+1 and pushed for 120 minutes with epidural.   Pt pushed to +3- baby OA but slightly asynclitic.    Consented pt for VE for maternal exhaustion and she agreed after discussing R/B/Alt. VE placed and pulled 3 contractions with one pop-off.  SVD  female infant, Apgars 8,9, weight P.   The patient had one perineal second degree and one hymenal second degree lacerations, both repaired with 2-0 vicryl R. Fundus was firm. EBL was expected. Placenta was delivered intact. Vagina was clear.  Baby was vigorous to bedside.  Kimyata Milich A

## 2013-01-12 LAB — OB RESULTS CONSOLE GBS: GBS: NEGATIVE

## 2013-01-16 ENCOUNTER — Inpatient Hospital Stay (HOSPITAL_COMMUNITY)
Admission: AD | Admit: 2013-01-16 | Discharge: 2013-01-17 | Disposition: A | Discharge status: Home / Self Care | Payer: Medicaid Other | Source: Ambulatory Visit | Attending: Obstetrics and Gynecology | Admitting: Obstetrics and Gynecology

## 2013-01-16 ENCOUNTER — Encounter (HOSPITAL_COMMUNITY): Payer: Self-pay | Admitting: *Deleted

## 2013-01-16 DIAGNOSIS — O4700 False labor before 37 completed weeks of gestation, unspecified trimester: Secondary | ICD-10-CM | POA: Insufficient documentation

## 2013-01-17 MED ORDER — NIFEDIPINE 10 MG PO CAPS
10.0000 mg | ORAL_CAPSULE | Freq: Once | ORAL | Status: AC
  Administered 2013-01-17: 10 mg via ORAL
  Filled 2013-01-17: qty 1

## 2013-01-17 NOTE — MAU Note (Signed)
Worked for 5 hours today, started having contractions around 2030, drank water tried to lay on side.  Did not feel baby move.

## 2013-02-01 ENCOUNTER — Encounter (HOSPITAL_COMMUNITY): Payer: Self-pay | Admitting: Family

## 2013-02-01 ENCOUNTER — Inpatient Hospital Stay (HOSPITAL_COMMUNITY)
Admission: AD | Admit: 2013-02-01 | Discharge: 2013-02-01 | Disposition: A | Discharge status: Home / Self Care | Payer: Medicaid Other | Source: Ambulatory Visit | Attending: Obstetrics & Gynecology | Admitting: Obstetrics & Gynecology

## 2013-02-01 DIAGNOSIS — O479 False labor, unspecified: Secondary | ICD-10-CM | POA: Insufficient documentation

## 2013-02-01 HISTORY — DX: Chlamydial infection, unspecified: A74.9

## 2013-02-01 LAB — POCT FERN TEST: POCT Fern Test: NEGATIVE

## 2013-02-01 NOTE — MAU Note (Signed)
Pt reports having ctx q 10-12 min and increased mucusy discharge. Good fetal movement reported.

## 2013-02-01 NOTE — MAU Note (Signed)
Patient presents to MAU with c/o contractions every 10 minutes since 1300 today. Reports increased clear discharge today. Denies vaginal bleeding. Reports + fetal movement.

## 2013-02-05 ENCOUNTER — Encounter (HOSPITAL_COMMUNITY): Payer: Self-pay | Admitting: *Deleted

## 2013-02-05 ENCOUNTER — Encounter (HOSPITAL_COMMUNITY): Payer: Self-pay | Admitting: Anesthesiology

## 2013-02-05 ENCOUNTER — Inpatient Hospital Stay (HOSPITAL_COMMUNITY): Payer: Medicaid Other | Admitting: Anesthesiology

## 2013-02-05 ENCOUNTER — Inpatient Hospital Stay (HOSPITAL_COMMUNITY)
Admission: AD | Admit: 2013-02-05 | Discharge: 2013-02-07 | DRG: 775 | Disposition: A | Discharge status: Home / Self Care | Payer: Medicaid Other | Source: Ambulatory Visit | Attending: Obstetrics and Gynecology | Admitting: Obstetrics and Gynecology

## 2013-02-05 DIAGNOSIS — O9903 Anemia complicating the puerperium: Secondary | ICD-10-CM | POA: Diagnosis not present

## 2013-02-05 DIAGNOSIS — D649 Anemia, unspecified: Secondary | ICD-10-CM | POA: Diagnosis not present

## 2013-02-05 LAB — CBC
HCT: 32.5 % — ABNORMAL LOW (ref 36.0–46.0)
Hemoglobin: 10.7 g/dL — ABNORMAL LOW (ref 12.0–15.0)
MCV: 86.7 fL (ref 78.0–100.0)
RBC: 3.75 MIL/uL — ABNORMAL LOW (ref 3.87–5.11)
RDW: 14.3 % (ref 11.5–15.5)
WBC: 7.4 10*3/uL (ref 4.0–10.5)

## 2013-02-05 MED ORDER — IBUPROFEN 800 MG PO TABS
800.0000 mg | ORAL_TABLET | Freq: Three times a day (TID) | ORAL | Status: DC
  Administered 2013-02-05 – 2013-02-06 (×4): 800 mg via ORAL
  Filled 2013-02-05 (×5): qty 1

## 2013-02-05 MED ORDER — OXYTOCIN 40 UNITS IN LACTATED RINGERS INFUSION - SIMPLE MED
62.5000 mL/h | INTRAVENOUS | Status: DC
  Administered 2013-02-05: 999 mL/h via INTRAVENOUS
  Filled 2013-02-05: qty 1000

## 2013-02-05 MED ORDER — PHENYLEPHRINE 40 MCG/ML (10ML) SYRINGE FOR IV PUSH (FOR BLOOD PRESSURE SUPPORT): 80.0000 ug | PREFILLED_SYRINGE | INTRAVENOUS | Status: DC | PRN

## 2013-02-05 MED ORDER — ONDANSETRON HCL 4 MG/2ML IJ SOLN: 4.0000 mg | INTRAMUSCULAR | Status: DC | PRN

## 2013-02-05 MED ORDER — OXYCODONE-ACETAMINOPHEN 5-325 MG PO TABS: 1.0000 | ORAL_TABLET | ORAL | Status: DC | PRN

## 2013-02-05 MED ORDER — PRENATAL MULTIVITAMIN CH
1.0000 | ORAL_TABLET | Freq: Every day | ORAL | Status: DC
  Administered 2013-02-06 – 2013-02-07 (×2): 1 via ORAL
  Filled 2013-02-05 (×2): qty 1

## 2013-02-05 MED ORDER — SIMETHICONE 80 MG PO CHEW: 80.0000 mg | CHEWABLE_TABLET | ORAL | Status: DC | PRN

## 2013-02-05 MED ORDER — MEASLES, MUMPS & RUBELLA VAC ~~LOC~~ INJ: 0.5000 mL | INJECTION | Freq: Once | SUBCUTANEOUS | Status: DC

## 2013-02-05 MED ORDER — ZOLPIDEM TARTRATE 5 MG PO TABS: 5.0000 mg | ORAL_TABLET | Freq: Every evening | ORAL | Status: DC | PRN

## 2013-02-05 MED ORDER — METHYLERGONOVINE MALEATE 0.2 MG/ML IJ SOLN: 0.2000 mg | INTRAMUSCULAR | Status: DC | PRN

## 2013-02-05 MED ORDER — ONDANSETRON HCL 4 MG/2ML IJ SOLN: 4.0000 mg | Freq: Four times a day (QID) | INTRAMUSCULAR | Status: DC | PRN

## 2013-02-05 MED ORDER — DIPHENHYDRAMINE HCL 25 MG PO CAPS: 25.0000 mg | ORAL_CAPSULE | Freq: Four times a day (QID) | ORAL | Status: DC | PRN

## 2013-02-05 MED ORDER — FENTANYL 2.5 MCG/ML BUPIVACAINE 1/10 % EPIDURAL INFUSION (WH - ANES)
14.0000 mL/h | INTRAMUSCULAR | Status: DC | PRN
  Administered 2013-02-05: 14 mL/h via EPIDURAL
  Filled 2013-02-05 (×2): qty 125

## 2013-02-05 MED ORDER — LACTATED RINGERS IV SOLN: 500.0000 mL | Freq: Once | INTRAVENOUS | Status: DC

## 2013-02-05 MED ORDER — METHYLERGONOVINE MALEATE 0.2 MG PO TABS: 0.2000 mg | ORAL_TABLET | ORAL | Status: DC | PRN

## 2013-02-05 MED ORDER — ACETAMINOPHEN 325 MG PO TABS: 650.0000 mg | ORAL_TABLET | ORAL | Status: DC | PRN

## 2013-02-05 MED ORDER — TETANUS-DIPHTH-ACELL PERTUSSIS 5-2.5-18.5 LF-MCG/0.5 IM SUSP
0.5000 mL | Freq: Once | INTRAMUSCULAR | Status: AC
  Administered 2013-02-06: 0.5 mL via INTRAMUSCULAR
  Filled 2013-02-05: qty 0.5

## 2013-02-05 MED ORDER — SENNOSIDES-DOCUSATE SODIUM 8.6-50 MG PO TABS
2.0000 | ORAL_TABLET | Freq: Every day | ORAL | Status: DC
  Administered 2013-02-05 – 2013-02-06 (×2): 2 via ORAL

## 2013-02-05 MED ORDER — WITCH HAZEL-GLYCERIN EX PADS: 1.0000 "application " | MEDICATED_PAD | CUTANEOUS | Status: DC | PRN

## 2013-02-05 MED ORDER — ONDANSETRON HCL 4 MG PO TABS: 4.0000 mg | ORAL_TABLET | ORAL | Status: DC | PRN

## 2013-02-05 MED ORDER — BENZOCAINE-MENTHOL 20-0.5 % EX AERO
1.0000 "application " | INHALATION_SPRAY | CUTANEOUS | Status: DC | PRN
  Administered 2013-02-05: 1 via TOPICAL
  Filled 2013-02-05: qty 56

## 2013-02-05 MED ORDER — LANOLIN HYDROUS EX OINT: TOPICAL_OINTMENT | CUTANEOUS | Status: DC | PRN

## 2013-02-05 MED ORDER — FENTANYL 2.5 MCG/ML BUPIVACAINE 1/10 % EPIDURAL INFUSION (WH - ANES): 14.0000 mL/h | INTRAMUSCULAR | Status: DC | PRN

## 2013-02-05 MED ORDER — SODIUM CHLORIDE 0.9 % IJ SOLN: 3.0000 mL | Freq: Two times a day (BID) | INTRAMUSCULAR | Status: DC

## 2013-02-05 MED ORDER — CITRIC ACID-SODIUM CITRATE 334-500 MG/5ML PO SOLN: 30.0000 mL | ORAL | Status: DC | PRN

## 2013-02-05 MED ORDER — EPHEDRINE 5 MG/ML INJ
10.0000 mg | INTRAVENOUS | Status: DC | PRN
  Filled 2013-02-05: qty 4

## 2013-02-05 MED ORDER — LIDOCAINE HCL (PF) 1 % IJ SOLN
30.0000 mL | INTRAMUSCULAR | Status: DC | PRN
  Administered 2013-02-05: 30 mL via SUBCUTANEOUS
  Filled 2013-02-05: qty 30

## 2013-02-05 MED ORDER — BUTORPHANOL TARTRATE 1 MG/ML IJ SOLN: 1.0000 mg | INTRAMUSCULAR | Status: DC | PRN

## 2013-02-05 MED ORDER — LACTATED RINGERS IV SOLN
INTRAVENOUS | Status: DC
  Administered 2013-02-05 (×3): via INTRAVENOUS

## 2013-02-05 MED ORDER — EPHEDRINE 5 MG/ML INJ: 10.0000 mg | INTRAVENOUS | Status: DC | PRN

## 2013-02-05 MED ORDER — FENTANYL 2.5 MCG/ML BUPIVACAINE 1/10 % EPIDURAL INFUSION (WH - ANES)
INTRAMUSCULAR | Status: DC | PRN
  Administered 2013-02-05: 14 mL/h via EPIDURAL

## 2013-02-05 MED ORDER — DIBUCAINE 1 % RE OINT: 1.0000 "application " | TOPICAL_OINTMENT | RECTAL | Status: DC | PRN

## 2013-02-05 MED ORDER — IBUPROFEN 600 MG PO TABS: 600.0000 mg | ORAL_TABLET | Freq: Four times a day (QID) | ORAL | Status: DC | PRN

## 2013-02-05 MED ORDER — LIDOCAINE HCL (PF) 1 % IJ SOLN
INTRAMUSCULAR | Status: DC | PRN
  Administered 2013-02-05 (×2): 4 mL

## 2013-02-05 MED ORDER — SODIUM CHLORIDE 0.9 % IJ SOLN
3.0000 mL | INTRAMUSCULAR | Status: DC | PRN
  Administered 2013-02-05: 3 mL via INTRAVENOUS

## 2013-02-05 MED ORDER — FLEET ENEMA 7-19 GM/118ML RE ENEM: 1.0000 | ENEMA | Freq: Every day | RECTAL | Status: DC | PRN

## 2013-02-05 MED ORDER — OXYTOCIN BOLUS FROM INFUSION: 500.0000 mL | INTRAVENOUS | Status: DC

## 2013-02-05 MED ORDER — PHENYLEPHRINE 40 MCG/ML (10ML) SYRINGE FOR IV PUSH (FOR BLOOD PRESSURE SUPPORT)
80.0000 ug | PREFILLED_SYRINGE | INTRAVENOUS | Status: DC | PRN
  Filled 2013-02-05: qty 5

## 2013-02-05 MED ORDER — SODIUM CHLORIDE 0.9 % IV SOLN: 250.0000 mL | INTRAVENOUS | Status: DC | PRN

## 2013-02-05 MED ORDER — OXYCODONE-ACETAMINOPHEN 5-325 MG PO TABS
1.0000 | ORAL_TABLET | ORAL | Status: DC | PRN
  Administered 2013-02-06: 1 via ORAL
  Filled 2013-02-05 (×2): qty 1

## 2013-02-05 MED ORDER — LACTATED RINGERS IV SOLN: 500.0000 mL | INTRAVENOUS | Status: DC | PRN

## 2013-02-05 MED ORDER — MAGNESIUM HYDROXIDE 400 MG/5ML PO SUSP: 30.0000 mL | ORAL | Status: DC | PRN

## 2013-02-05 MED ORDER — DIPHENHYDRAMINE HCL 50 MG/ML IJ SOLN: 12.5000 mg | INTRAMUSCULAR | Status: DC | PRN

## 2013-02-05 MED ORDER — FERROUS SULFATE 325 (65 FE) MG PO TABS
325.0000 mg | ORAL_TABLET | Freq: Two times a day (BID) | ORAL | Status: DC
  Administered 2013-02-06 – 2013-02-07 (×2): 325 mg via ORAL
  Filled 2013-02-05 (×2): qty 1

## 2013-02-05 NOTE — H&P (Signed)
22 y.o. [redacted]w[redacted]d  G2P0010 comes in c/o labor.  Otherwise has good fetal movement and no bleeding.  Past Medical History  Diagnosis Date  . History of miscarriage   . Chlamydia     Past Surgical History  Procedure Laterality Date  . Tonsillectomy      OB History   Grav Para Term Preterm Abortions TAB SAB Ect Mult Living   2    1  1         # Outc Date GA Lbr Len/2nd Wgt Sex Del Anes PTL Lv   1 SAB            2 CUR               History   Social History  . Marital Status: Single    Spouse Name: N/A    Number of Children: N/A  . Years of Education: N/A   Occupational History  . Not on file.   Social History Main Topics  . Smoking status: Never Smoker   . Smokeless tobacco: Never Used  . Alcohol Use: No  . Drug Use: No  . Sexually Active: Yes    Birth Control/ Protection: None   Other Topics Concern  . Not on file   Social History Narrative  . No narrative on file   Review of patient's allergies indicates no known allergies.    Prenatal Transfer Tool  Maternal Diabetes: No Genetic Screening: Normal Maternal Ultrasounds/Referrals: Normal Fetal Ultrasounds or other Referrals:  None Maternal Substance Abuse:  No Significant Maternal Medications:  None Significant Maternal Lab Results: None  Other [id number].    Filed Vitals:   02/05/13 0806  BP:   Pulse: 81  Temp:   Resp:      Lungs/Cor:  NAD Abdomen:  soft, gravid Ex:  no cords, erythema SVE:  4/90/-2 per nurse FHTs:  130, good STV, NST R Toco:  q3   A/P   Term labor.  GBS neg.  Rachael Mann A

## 2013-02-05 NOTE — Anesthesia Preprocedure Evaluation (Addendum)
Anesthesia Evaluation  Patient identified by MRN, date of birth, ID band Patient awake    Reviewed: Allergy & Precautions, H&P , Patient's Chart, lab work & pertinent test results  Airway Mallampati: III TM Distance: >3 FB Neck ROM: full    Dental no notable dental hx. (+) Teeth Intact   Pulmonary neg pulmonary ROS,  breath sounds clear to auscultation  Pulmonary exam normal       Cardiovascular negative cardio ROS  Rhythm:regular Rate:Normal     Neuro/Psych negative neurological ROS  negative psych ROS   GI/Hepatic negative GI ROS, Neg liver ROS,   Endo/Other  negative endocrine ROS  Renal/GU negative Renal ROS  negative genitourinary   Musculoskeletal negative musculoskeletal ROS (+)   Abdominal Normal abdominal exam  (+)   Peds  Hematology negative hematology ROS (+)   Anesthesia Other Findings   Reproductive/Obstetrics (+) Pregnancy                          Anesthesia Physical Anesthesia Plan  ASA: II  Anesthesia Plan: Epidural   Post-op Pain Management:    Induction:   Airway Management Planned:   Additional Equipment:   Intra-op Plan:   Post-operative Plan:   Informed Consent: I have reviewed the patients History and Physical, chart, labs and discussed the procedure including the risks, benefits and alternatives for the proposed anesthesia with the patient or authorized representative who has indicated his/her understanding and acceptance.     Plan Discussed with: Anesthesiologist  Anesthesia Plan Comments:         Anesthesia Quick Evaluation

## 2013-02-05 NOTE — MAU Note (Signed)
Pt reports uc's since 0200.

## 2013-02-05 NOTE — Anesthesia Procedure Notes (Signed)
Epidural Patient location during procedure: OB Start time: 02/05/2013 7:56 AM  Staffing Anesthesiologist: Jos Cygan A. Performed by: anesthesiologist   Preanesthetic Checklist Completed: patient identified, site marked, surgical consent, pre-op evaluation, timeout performed, IV checked, risks and benefits discussed and monitors and equipment checked  Epidural Patient position: sitting Prep: site prepped and draped and DuraPrep Patient monitoring: continuous pulse ox and blood pressure Approach: midline Injection technique: LOR air  Needle:  Needle type: Tuohy  Needle gauge: 17 G Needle length: 9 cm and 9 Needle insertion depth: 4 cm Catheter type: closed end flexible Catheter size: 19 Gauge Catheter at skin depth: 9 cm Test dose: negative and Other  Assessment Events: blood not aspirated, injection not painful, no injection resistance, negative IV test and no paresthesia  Additional Notes Patient identified. Risks and benefits discussed including failed block, incomplete  Pain control, post dural puncture headache, nerve damage, paralysis, blood pressure Changes, nausea, vomiting, reactions to medications-both toxic and allergic and post Partum back pain. All questions were answered. Patient expressed understanding and wished to proceed. Sterile technique was used throughout procedure. Epidural site was Dressed with sterile barrier dressing. No paresthesias, signs of intravascular injection Or signs of intrathecal spread were encountered.  Patient was more comfortable after the epidural was dosed. Please see RN's note for documentation of vital signs and FHR which are stable.

## 2013-02-05 NOTE — MAU Note (Signed)
Plan of care d/w Dr. Dareen Piano. Reevaluate cervix in an hour.

## 2013-02-06 LAB — CBC
HCT: 24.9 % — ABNORMAL LOW (ref 36.0–46.0)
Hemoglobin: 8.2 g/dL — ABNORMAL LOW (ref 12.0–15.0)
MCH: 28.6 pg (ref 26.0–34.0)
MCHC: 32.9 g/dL (ref 30.0–36.0)
MCV: 86.8 fL (ref 78.0–100.0)
RBC: 2.87 MIL/uL — ABNORMAL LOW (ref 3.87–5.11)

## 2013-02-06 MED ORDER — FERROUS SULFATE 325 (65 FE) MG PO TABS: 325.0000 mg | ORAL_TABLET | Freq: Three times a day (TID) | ORAL | Status: DC

## 2013-02-06 NOTE — Progress Notes (Signed)
UR chart review completed.  

## 2013-02-06 NOTE — Anesthesia Postprocedure Evaluation (Signed)
Anesthesia Post Note  Patient: Rachael Mann  Procedure(s) Performed: * No procedures listed *  Anesthesia type: Spinal  Patient location: Mother Baby  Post pain: Pain level controlled  Post assessment: Post-op Vital signs reviewed  Last Vitals: BP 107/55  Pulse 77  Temp(Src) 36.9 C (Oral)  Resp 18  Ht 5\' 5"  (1.651 m)  Wt 166 lb (75.297 kg)  BMI 27.62 kg/m2  SpO2 100%  LMP 05/04/2012  Post vital signs: Reviewed  Level of consciousness: awake  Complications: No apparent anesthesia complicationsAnesthesia Post Note  Patient: Rachael Mann  Procedure(s) Performed: * No procedures listed *  Anesthesia type: Spinal  Patient location: Mother Baby  Post pain: Pain level controlled  Post assessment: Post-op Vital signs reviewed  Last Vitals: BP 107/55  Pulse 77  Temp(Src) 36.9 C (Oral)  Resp 18  Ht 5\' 5"  (1.651 m)  Wt 166 lb (75.297 kg)  BMI 27.62 kg/m2  SpO2 100%  LMP 05/04/2012  Post vital signs: Reviewed  Level of consciousness: awake  Complications: No apparent anesthesia complications

## 2013-02-06 NOTE — Progress Notes (Signed)
Patient is eating, ambulating, voiding.  Pain control is good.  Appropriate lochia, no complaints.   Filed Vitals:   02/05/13 2015 02/05/13 2115 02/06/13 0115 02/06/13 0541  BP: 123/75 116/73 99/55 107/55  Pulse: 81 89 84 77  Temp: 98 F (36.7 C) 98 F (36.7 C) 98.7 F (37.1 C) 98.4 F (36.9 C)  TempSrc: Oral Oral Oral Oral  Resp: 18 18 18 18   Height:      Weight:      SpO2:        Fundus firm Perineum without swelling. No CT  Lab Results  Component Value Date   WBC 12.4* 02/06/2013   HGB 8.2* 02/06/2013   HCT 24.9* 02/06/2013   MCV 86.8 02/06/2013   PLT 129* 02/06/2013    --/--/A POS (08/15 1725)  A/P Post partum day 1. Anemia - iron bid  Routine care.  Expect d/c 4/12.    Philip Aspen

## 2013-02-07 NOTE — Discharge Summary (Signed)
Obstetric Discharge Summary Reason for Admission: onset of labor Prenatal Procedures: none Intrapartum Procedures: vacuum Postpartum Procedures: none Complications-Operative and Postpartum: 2nd degree perineal laceration, 2nd degree hymenal Hemoglobin  Date Value Range Status  02/06/2013 8.2* 12.0 - 15.0 g/dL Final     REPEATED TO VERIFY     DELTA CHECK NOTED     HCT  Date Value Range Status  02/06/2013 24.9* 36.0 - 46.0 % Final    Physical Exam:  General: alert and cooperative Lochia: appropriate Uterine Fundus: firm DVT Evaluation: No evidence of DVT seen on physical exam.  Discharge Diagnoses: Term Pregnancy-delivered  Discharge Information: Date: 02/07/2013 Activity: pelvic rest Diet: routine Medications: PNV, Ibuprofen, Colace and Iron Condition: stable Instructions: refer to practice specific booklet Discharge to: home Follow-up Information   Follow up with HORVATH,MICHELLE A, MD In 4 weeks.   Contact information:   [address] Rachael Mann [place] [address] [phone]       Newborn Data: Live born female  Birth Weight: 7 lb 10.2 oz (3464 g) APGAR: 8, 9  Home with mother.  Rachael Mann 02/07/2013, 9:58 AM

## 2013-10-21 ENCOUNTER — Other Ambulatory Visit: Payer: Self-pay | Admitting: Obstetrics and Gynecology

## 2014-04-14 IMAGING — US US OB NUCHAL TRANSLUCENCY 1ST GEST
1 series · 13 of 28 positions shown · non-contrast
Comparison: none

[Series 1: us ob nuchal translucency 1st gest · 0.17mm/px · 13 of 37 slices shown]
[im 2/37]
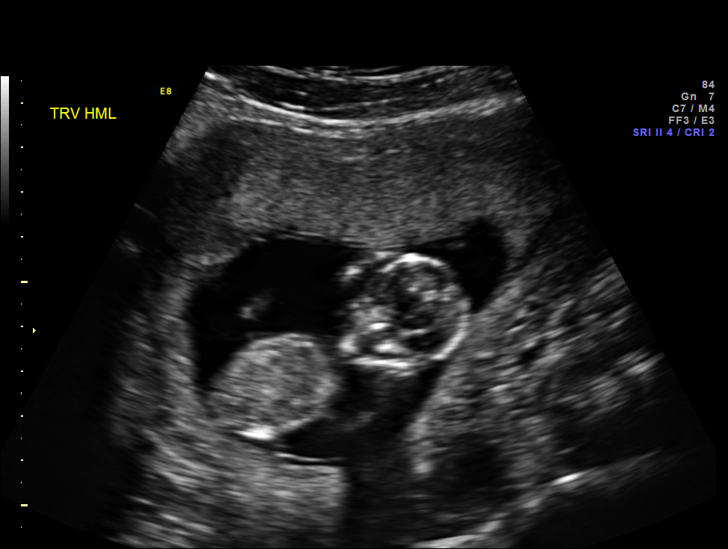
[im 5/37]
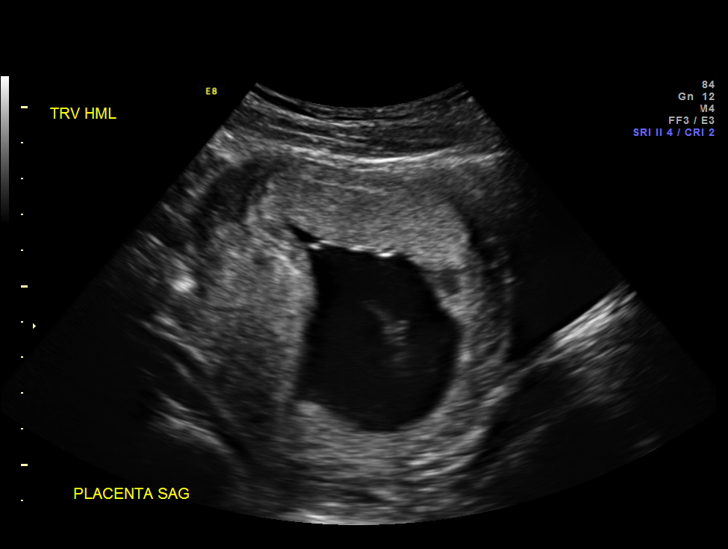
[im 7/37]
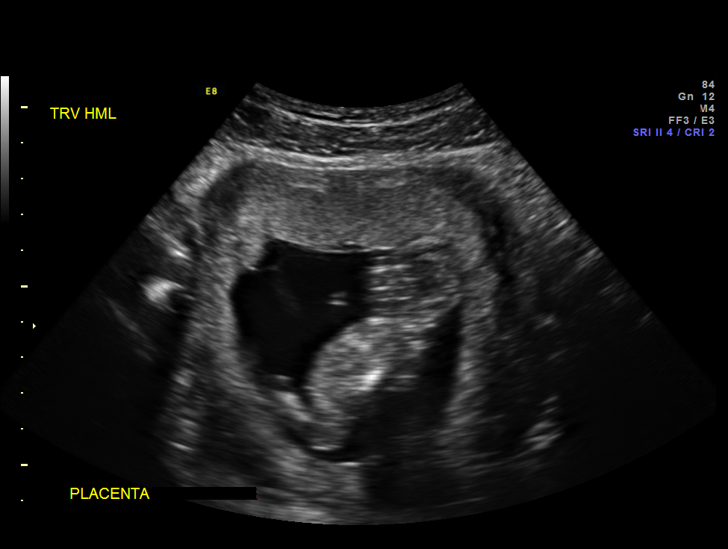
[im 10/37]
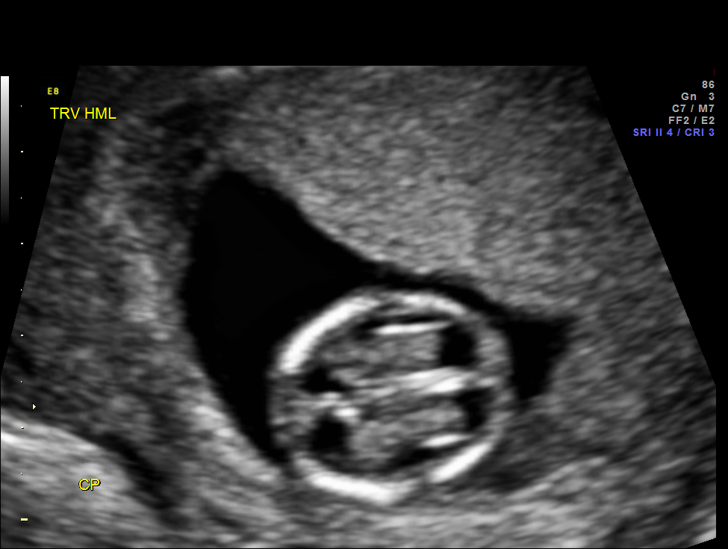
[im 13/37]
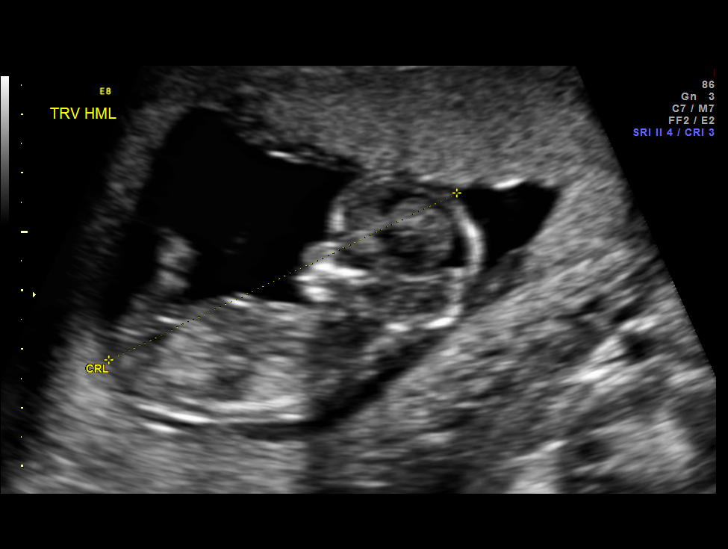
[im 15/37]
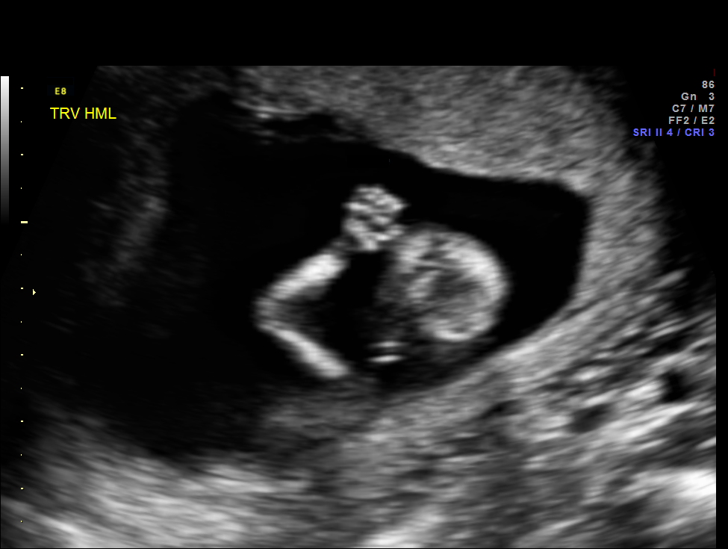
[im 19/37]
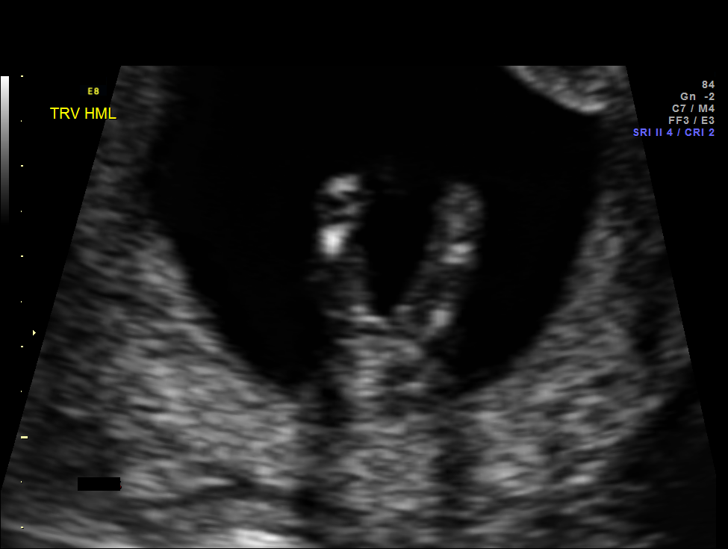
[im 22/37]
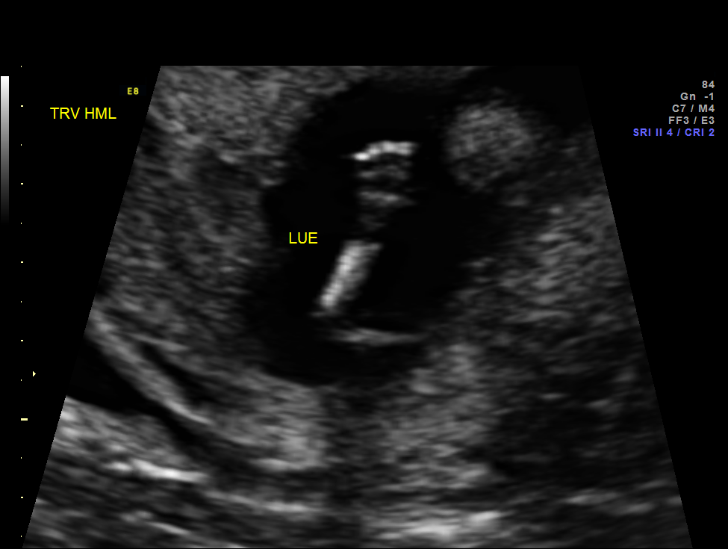
[im 25/37]
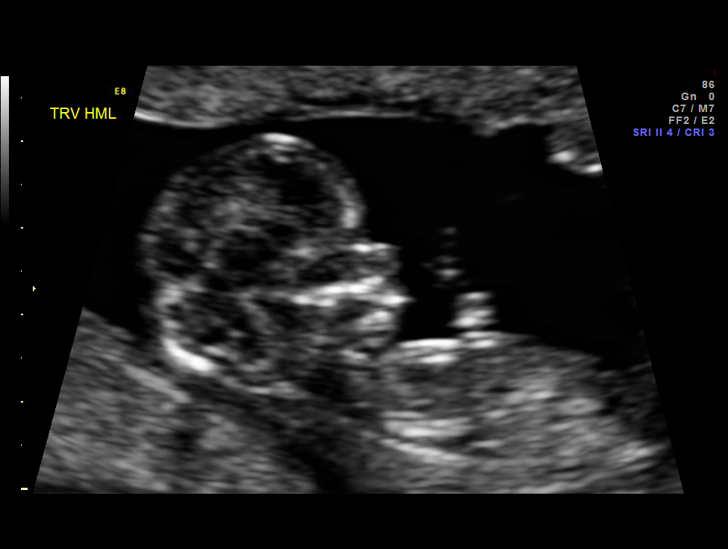
[im 27/37]
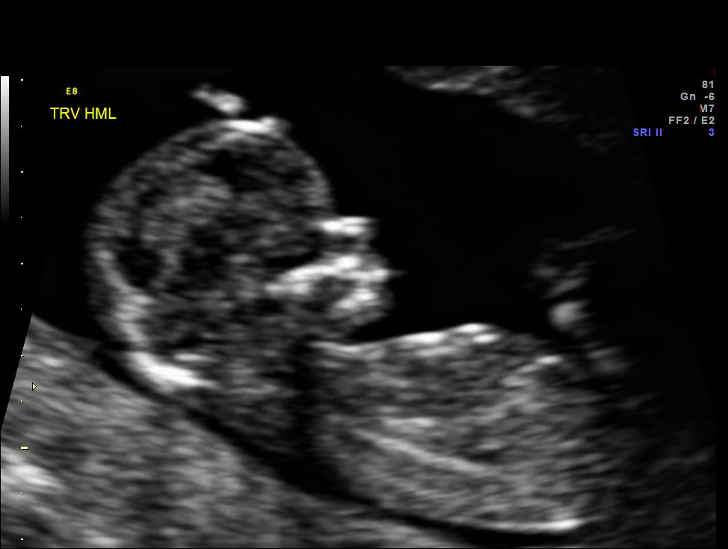
[im 30/37]
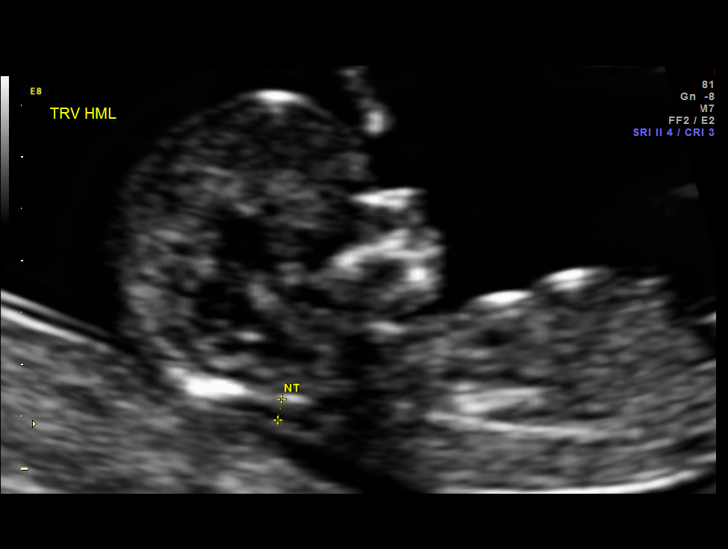
[im 33/37]
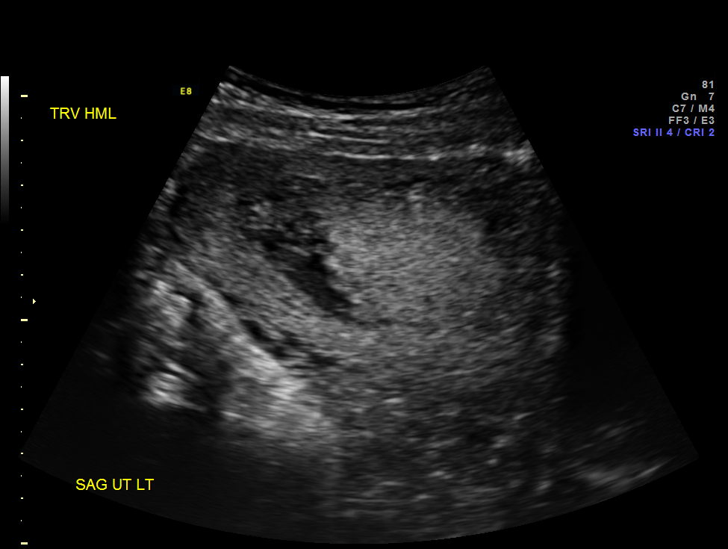
[im 35/37]
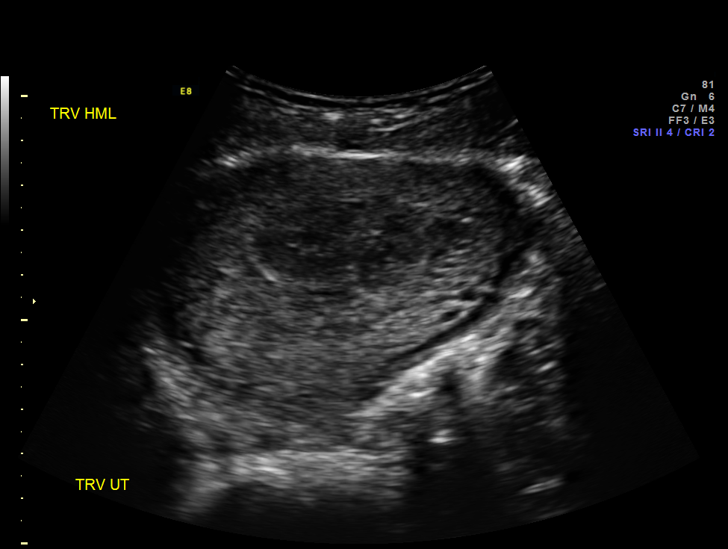

[13 of 28 positions shown; findings below may reference images not displayed]

OBSTETRICS REPORT
                      (Signed Final 08/08/2012 [DATE])

Service(s) Provided

 US FETAL NUCHAL TRANSLUCENCY                          76813.0
 MEASUREMENT
Indications

 First trimester aneuploidy screen (NT)
Fetal Evaluation

 Num Of Fetuses:    1
 Fetal Heart Rate:  167                          bpm
 Cardiac Activity:  Observed
 Presentation:      Variable
 Placenta:          Anterior

 Amniotic Fluid
 AFI FV:      Subjectively within normal limits
Biometry

 CRL:       67  mm     G. Age:  12w 6d                 EDD:    02/14/13

 NT:       2.0  mm
Gestational Age

 LMP:           13w 5d        Date:  05/04/12                 EDD:   02/08/13
 Best:          12w 6d     Det. By:  U/S C R L   (07/01/12)   EDD:   02/14/13
Anatomy

 Cranium:          Appears normal         Cord Vessels:     Appears normal (3
                                                            vessel cord)
 Choroid Plexus:   Appears normal         Lower             Visualized
                                          Extremities:
 Stomach:          Appears normal, left   Upper             Visualized
                   sided                  Extremities:

 Other:  Nasal bone visualized.
Cervix Uterus Adnexa

 Cervix:       Normal appearance by transabdominal scan.

 Adnexa:     No abnormality visualized.
Impression

 Single IUP at 12 [DATE] weeks
 NT of 2.0 mm noted; nasal bone visualized
 First trimester aneuploidy screen performed as noted above.
 Please do not draw triple/quad screen, though patient should
 be offered MSAFP for neural tube defect screening.
Recommendations

 Ultrasound for fetal anatomic evaluation at 18-19 wks GA.
 Please contact our clinic if you would like to have this
 procedure performed here.

## 2014-08-30 ENCOUNTER — Encounter (HOSPITAL_COMMUNITY): Payer: Self-pay | Admitting: *Deleted

## 2014-10-12 ENCOUNTER — Other Ambulatory Visit: Payer: Self-pay | Admitting: Obstetrics and Gynecology

## 2014-10-13 LAB — CYTOLOGY - PAP

## 2014-11-02 ENCOUNTER — Other Ambulatory Visit: Payer: Self-pay | Admitting: Obstetrics and Gynecology

## 2015-06-08 NOTE — Telephone Encounter (Signed)
Pt requesting gc/chlamydia results. Informed pt chlamydia positive, her & [facility].    By Claudie Revering, RN

## 2015-06-21 ENCOUNTER — Other Ambulatory Visit: Payer: Self-pay

## 2015-06-22 LAB — CYTOLOGY - PAP

## 2016-02-19 ENCOUNTER — Inpatient Hospital Stay (HOSPITAL_COMMUNITY): Payer: Self-pay

## 2016-02-19 ENCOUNTER — Inpatient Hospital Stay (HOSPITAL_COMMUNITY)
Admission: AD | Admit: 2016-02-19 | Discharge: 2016-02-19 | Disposition: A | Discharge status: Home / Self Care | Payer: Medicaid Other | Source: Ambulatory Visit | Attending: Obstetrics & Gynecology | Admitting: Obstetrics & Gynecology

## 2016-02-19 ENCOUNTER — Encounter (HOSPITAL_COMMUNITY): Payer: Self-pay

## 2016-02-19 DIAGNOSIS — R102 Pelvic and perineal pain: Secondary | ICD-10-CM | POA: Insufficient documentation

## 2016-02-19 DIAGNOSIS — O26891 Other specified pregnancy related conditions, first trimester: Secondary | ICD-10-CM | POA: Insufficient documentation

## 2016-02-19 DIAGNOSIS — O209 Hemorrhage in early pregnancy, unspecified: Secondary | ICD-10-CM | POA: Insufficient documentation

## 2016-02-19 DIAGNOSIS — Z3A00 Weeks of gestation of pregnancy not specified: Secondary | ICD-10-CM | POA: Insufficient documentation

## 2016-02-19 DIAGNOSIS — O3680X0 Pregnancy with inconclusive fetal viability, not applicable or unspecified: Secondary | ICD-10-CM

## 2016-02-19 LAB — WET PREP, GENITAL
Sperm: NONE SEEN
Trich, Wet Prep: NONE SEEN
YEAST WET PREP: NONE SEEN

## 2016-02-19 LAB — URINALYSIS, ROUTINE W REFLEX MICROSCOPIC
BILIRUBIN URINE: NEGATIVE
Glucose, UA: NEGATIVE mg/dL
HGB URINE DIPSTICK: NEGATIVE
KETONES UR: 15 mg/dL — AB
Leukocytes, UA: NEGATIVE
Nitrite: NEGATIVE
PROTEIN: NEGATIVE mg/dL
SPECIFIC GRAVITY, URINE: 1.02 (ref 1.005–1.030)
pH: 6.5 (ref 5.0–8.0)

## 2016-02-19 LAB — CBC
HEMATOCRIT: 35.7 % — AB (ref 36.0–46.0)
HEMOGLOBIN: 12.3 g/dL (ref 12.0–15.0)
MCH: 31.5 pg (ref 26.0–34.0)
MCHC: 34.5 g/dL (ref 30.0–36.0)
MCV: 91.5 fL (ref 78.0–100.0)
Platelets: 245 10*3/uL (ref 150–400)
RBC: 3.9 MIL/uL (ref 3.87–5.11)
RDW: 11.9 % (ref 11.5–15.5)
WBC: 5.1 10*3/uL (ref 4.0–10.5)

## 2016-02-19 LAB — HCG, QUANTITATIVE, PREGNANCY: HCG, BETA CHAIN, QUANT, S: 119 m[IU]/mL — AB (ref <5)

## 2016-02-19 LAB — POCT PREGNANCY, URINE: PREG TEST UR: POSITIVE — AB

## 2016-02-19 NOTE — MAU Note (Signed)
Pt states that she has cramping all day and some vaginal spotting that started about 45mins ago. LMP: 01/11/2016-had IUD taken out 12/14/2015.

## 2016-02-19 NOTE — Discharge Instructions (Signed)

## 2016-02-19 NOTE — MAU Provider Note (Signed)
History     CSN: [id number]  Arrival date and time: 02/19/16 2126   First Provider Initiated Contact with Patient 02/19/16 2157      Chief Complaint  Patient presents with  . Possible Pregnancy  . Vaginal Bleeding  . Abdominal Cramping   Vaginal Bleeding The patient's primary symptoms include pelvic pain and vaginal bleeding. This is a new problem. The current episode started in the past 7 days. The problem occurs intermittently. The problem has been gradually worsening. Pain severity now: 7/10  The problem affects both sides. She is pregnant. Associated symptoms include abdominal pain and nausea. Pertinent negatives include no chills, constipation, diarrhea, dysuria, fever, frequency, urgency or vomiting. The vaginal discharge was white. The vaginal bleeding is spotting. She has been passing clots. She has been passing tissue. The symptoms are aggravated by intercourse. She has tried nothing for the symptoms. She is sexually active. It is unknown whether or not her partner has an STD. Contraceptive use: Mirena IUD removed on 12/14/15  Her menstrual history has been irregular (LMP 01/11/16 ).   Past Medical History  Diagnosis Date  . History of miscarriage   . Chlamydia     Past Surgical History  Procedure Laterality Date  . Tonsillectomy      Family History  Problem Relation Age of Onset  . Anesthesia problems Neg Hx   . Other Neg Hx   . Diabetes Maternal Grandmother     Social History  Substance Use Topics  . Smoking status: Never Smoker   . Smokeless tobacco: Never Used  . Alcohol Use: No    Allergies: No Known Allergies  Prescriptions prior to admission  Medication Sig Dispense Refill Last Dose  . IRON PO Take 1 tablet by mouth daily. Unknown strength/salt   02/04/2013 at Unknown  . Prenatal Vit-Fe Fumarate-FA (PRENATAL MULTIVITAMIN) TABS Take 1 tablet by mouth daily. 30 tablet 5 02/04/2013 at Unknown    Review of Systems  Constitutional: Negative for fever and  chills.  Gastrointestinal: Positive for nausea and abdominal pain. Negative for vomiting, diarrhea and constipation.  Genitourinary: Positive for vaginal bleeding and pelvic pain. Negative for dysuria, urgency and frequency.   Physical Exam   Blood pressure 111/57, pulse 87, temperature 98.2 F (36.8 C), temperature source Oral, resp. rate 18, height  (1.651 m), weight 66.769 kg (147 lb 3.2 oz), last menstrual period 01/11/2016, unknown if currently breastfeeding.  Physical Exam  Nursing note and vitals reviewed. Constitutional: She is oriented to person, place, and time. She appears well-developed and well-nourished. No distress.  HENT:  Head: Normocephalic.  Cardiovascular: Normal rate.   Respiratory: Effort normal.  GI: Soft. There is no tenderness. There is no rebound.  Neurological: She is alert and oriented to person, place, and time.  Skin: Skin is warm and dry.  Psychiatric: She has a normal mood and affect.   Results for orders placed or performed during the hospital encounter of 02/19/16 (from the past 24 hour(s))  Urinalysis, Routine w reflex microscopic (not at [facility])     Status: Abnormal   Collection Time: 02/19/16  9:40 PM  Result Value Ref Range   Color, Urine YELLOW YELLOW   APPearance CLEAR CLEAR   Specific Gravity, Urine 1.020 1.005 - 1.030   pH 6.5 5.0 - 8.0   Glucose, UA NEGATIVE NEGATIVE mg/dL   Hgb urine dipstick NEGATIVE NEGATIVE   Bilirubin Urine NEGATIVE NEGATIVE   Ketones, ur 15 (A) NEGATIVE mg/dL   Protein, ur NEGATIVE NEGATIVE  mg/dL   Nitrite NEGATIVE NEGATIVE   Leukocytes, UA NEGATIVE NEGATIVE  Pregnancy, urine POC     Status: Abnormal   Collection Time: 02/19/16  9:48 PM  Result Value Ref Range   Preg Test, Ur POSITIVE (A) NEGATIVE  Wet prep, genital     Status: Abnormal   Collection Time: 02/19/16 10:00 PM  Result Value Ref Range   Yeast Wet Prep HPF POC NONE SEEN NONE SEEN   Trich, Wet Prep NONE SEEN NONE SEEN   Clue Cells Wet Prep HPF  POC PRESENT (A) NONE SEEN   WBC, Wet Prep HPF POC FEW (A) NONE SEEN   Sperm NONE SEEN   CBC     Status: Abnormal   Collection Time: 02/19/16 10:01 PM  Result Value Ref Range   WBC 5.1 4.0 - 10.5 K/uL   RBC 3.90 3.87 - 5.11 MIL/uL   Hemoglobin 12.3 12.0 - 15.0 g/dL   HCT 40.935.7 (L) 81.136.0 - 91.446.0 %   MCV 91.5 78.0 - 100.0 fL   MCH 31.5 26.0 - 34.0 pg   MCHC 34.5 30.0 - 36.0 g/dL   RDW 78.211.9 95.611.5 - 21.315.5 %   Platelets 245 150 - 400 K/uL  hCG, quantitative, pregnancy     Status: Abnormal   Collection Time: 02/19/16 10:01 PM  Result Value Ref Range   hCG, Beta Chain, Quant, S 119 (H) <5 mIU/mL   [place] Ob Comp Less 14 Wks  02/19/2016  CLINICAL DATA:  Acute onset of pelvic pain and vaginal spotting. Initial encounter. EXAM: OBSTETRIC <14 WK US AND TRANSVAGINAL OB US TECHNIQUE: Both transabdominal and transvaginal ultrasound examinations were performed for complete evaluation of the gestation as well as the maternal uterus, adnexal regions, and pelvic cul-de-sac. Transvaginal technique was performed to assess early pregnancy. COMPARISON:  Pelvic ultrasound performed 08/08/2012 FINDINGS: Intrauterine gestational sac: None seen. Yolk sac:  N/A Embryo:  N/A Subchorionic hemorrhage:  None visualized. Maternal uterus/adnexae: The uterus is unremarkable in appearance. The ovaries are unremarkable in appearance. There is no evidence for ovarian torsion. The right ovary measures 4.0 x 1.9 x 2.7 cm, while the left ovary measures 2.7 x 1.6 x 2.2 cm. No suspicious adnexal masses are seen. No free fluid is seen within the pelvic cul-de-sac. IMPRESSION: No intrauterine gestational sac seen. No evidence for ectopic pregnancy at this time. This remains within normal limits, given the quantitative beta HCG level of 119. If the quantitative HCG level continues to trend upward, follow-up pelvic ultrasound could be performed in 2 weeks for further evaluation. Electronically Signed   By: Roanna RaiderJeffery  Chang M.D.   On: 02/19/2016 23:44    [place] Ob Transvaginal  02/19/2016  CLINICAL DATA:  Acute onset of pelvic pain and vaginal spotting. Initial encounter. EXAM: OBSTETRIC <14 WK US AND TRANSVAGINAL OB US TECHNIQUE: Both transabdominal and transvaginal ultrasound examinations were performed for complete evaluation of the gestation as well as the maternal uterus, adnexal regions, and pelvic cul-de-sac. Transvaginal technique was performed to assess early pregnancy. COMPARISON:  Pelvic ultrasound performed 08/08/2012 FINDINGS: Intrauterine gestational sac: None seen. Yolk sac:  N/A Embryo:  N/A Subchorionic hemorrhage:  None visualized. Maternal uterus/adnexae: The uterus is unremarkable in appearance. The ovaries are unremarkable in appearance. There is no evidence for ovarian torsion. The right ovary measures 4.0 x 1.9 x 2.7 cm, while the left ovary measures 2.7 x 1.6 x 2.2 cm. No suspicious adnexal masses are seen. No free fluid is seen within the pelvic cul-de-sac. IMPRESSION: No intrauterine  gestational sac seen. No evidence for ectopic pregnancy at this time. This remains within normal limits, given the quantitative beta HCG level of 119. If the quantitative HCG level continues to trend upward, follow-up pelvic ultrasound could be performed in 2 weeks for further evaluation. Electronically Signed   By: Roanna Raider M.D.   On: 02/19/2016 23:44     MAU Course  Procedures  MDM   Assessment and Plan   1. Pregnancy of unknown anatomic location   2. Pelvic pain in antepartum period, first trimester    DC home Comfort measures reviewed  1st Trimester precautions  Bleeding precautions Ectopic precautions RX: none  Return to MAU as needed   Follow-up Information    Follow up with [facility].   Specialty:  Obstetrics and Gynecology   Why:  [facility] 02/22/16 AT 11:00 AM    Contact information:   [address] [place] [address] [phone]        Tawnya Crook 02/19/2016, 9:58  PM

## 2016-02-20 LAB — GC/CHLAMYDIA PROBE AMP (~~LOC~~) NOT AT ARMC
Chlamydia: NEGATIVE
NEISSERIA GONORRHEA: NEGATIVE

## 2016-02-21 LAB — RPR: RPR Ser Ql: NONREACTIVE

## 2016-02-21 LAB — HIV ANTIBODY (ROUTINE TESTING W REFLEX): HIV Screen 4th Generation wRfx: NONREACTIVE

## 2016-02-22 ENCOUNTER — Other Ambulatory Visit: Payer: Medicaid Other

## 2016-02-23 ENCOUNTER — Telehealth: Payer: Self-pay

## 2016-02-23 NOTE — Telephone Encounter (Signed)
Spoke with patient who miss her appointment yesterday for a STAT BHCG. Pt stated she has followed up with her provider office at [facility] on 02/21/2016.

## 2016-07-10 ENCOUNTER — Other Ambulatory Visit: Payer: Self-pay | Admitting: Gastroenterology

## 2016-07-10 DIAGNOSIS — R1011 Right upper quadrant pain: Secondary | ICD-10-CM

## 2016-07-10 DIAGNOSIS — R112 Nausea with vomiting, unspecified: Secondary | ICD-10-CM

## 2016-07-17 ENCOUNTER — Encounter (HOSPITAL_COMMUNITY): Payer: 59

## 2016-07-17 ENCOUNTER — Encounter (HOSPITAL_COMMUNITY): Admission: RE | Admit: 2016-07-17 | Payer: 59 | Source: Ambulatory Visit

## 2016-08-03 ENCOUNTER — Ambulatory Visit (HOSPITAL_COMMUNITY)
Admission: RE | Admit: 2016-08-03 | Discharge: 2016-08-03 | Disposition: A | Discharge status: Home / Self Care | Payer: 59 | Source: Ambulatory Visit | Attending: Gastroenterology | Admitting: Gastroenterology

## 2016-08-03 ENCOUNTER — Encounter (HOSPITAL_COMMUNITY)
Admission: RE | Admit: 2016-08-03 | Discharge: 2016-08-03 | Disposition: A | Discharge status: Home / Self Care | Payer: 59 | Source: Ambulatory Visit | Attending: Gastroenterology | Admitting: Gastroenterology

## 2016-08-03 DIAGNOSIS — R112 Nausea with vomiting, unspecified: Secondary | ICD-10-CM | POA: Diagnosis not present

## 2016-08-03 DIAGNOSIS — R1011 Right upper quadrant pain: Secondary | ICD-10-CM | POA: Diagnosis not present

## 2016-08-16 ENCOUNTER — Ambulatory Visit (HOSPITAL_COMMUNITY): Admission: RE | Admit: 2016-08-16 | Payer: 59 | Source: Ambulatory Visit

## 2016-09-07 ENCOUNTER — Encounter (HOSPITAL_COMMUNITY): Payer: Self-pay

## 2016-09-07 ENCOUNTER — Inpatient Hospital Stay (HOSPITAL_COMMUNITY): Payer: 59

## 2016-09-07 ENCOUNTER — Inpatient Hospital Stay (HOSPITAL_COMMUNITY)
Admission: AD | Admit: 2016-09-07 | Discharge: 2016-09-07 | Disposition: A | Discharge status: Home / Self Care | Payer: 59 | Source: Ambulatory Visit | Attending: Obstetrics and Gynecology | Admitting: Obstetrics and Gynecology

## 2016-09-07 DIAGNOSIS — O3680X0 Pregnancy with inconclusive fetal viability, not applicable or unspecified: Secondary | ICD-10-CM

## 2016-09-07 DIAGNOSIS — A5901 Trichomonal vulvovaginitis: Secondary | ICD-10-CM | POA: Diagnosis not present

## 2016-09-07 DIAGNOSIS — O26891 Other specified pregnancy related conditions, first trimester: Secondary | ICD-10-CM

## 2016-09-07 DIAGNOSIS — R109 Unspecified abdominal pain: Secondary | ICD-10-CM

## 2016-09-07 DIAGNOSIS — Z3A01 Less than 8 weeks gestation of pregnancy: Secondary | ICD-10-CM | POA: Insufficient documentation

## 2016-09-07 DIAGNOSIS — O26899 Other specified pregnancy related conditions, unspecified trimester: Secondary | ICD-10-CM

## 2016-09-07 DIAGNOSIS — O98311 Other infections with a predominantly sexual mode of transmission complicating pregnancy, first trimester: Secondary | ICD-10-CM | POA: Diagnosis not present

## 2016-09-07 DIAGNOSIS — N760 Acute vaginitis: Secondary | ICD-10-CM | POA: Insufficient documentation

## 2016-09-07 DIAGNOSIS — R1030 Lower abdominal pain, unspecified: Secondary | ICD-10-CM | POA: Diagnosis present

## 2016-09-07 DIAGNOSIS — A599 Trichomoniasis, unspecified: Secondary | ICD-10-CM

## 2016-09-07 DIAGNOSIS — O209 Hemorrhage in early pregnancy, unspecified: Secondary | ICD-10-CM | POA: Diagnosis not present

## 2016-09-07 DIAGNOSIS — O23591 Infection of other part of genital tract in pregnancy, first trimester: Secondary | ICD-10-CM | POA: Diagnosis not present

## 2016-09-07 LAB — CBC
HEMATOCRIT: 35 % — AB (ref 36.0–46.0)
HEMOGLOBIN: 12.1 g/dL (ref 12.0–15.0)
MCH: 30.9 pg (ref 26.0–34.0)
MCHC: 34.6 g/dL (ref 30.0–36.0)
MCV: 89.3 fL (ref 78.0–100.0)
Platelets: 253 10*3/uL (ref 150–400)
RBC: 3.92 MIL/uL (ref 3.87–5.11)
RDW: 12 % (ref 11.5–15.5)
WBC: 5.4 10*3/uL (ref 4.0–10.5)

## 2016-09-07 LAB — POCT PREGNANCY, URINE: Preg Test, Ur: POSITIVE — AB

## 2016-09-07 LAB — URINALYSIS, ROUTINE W REFLEX MICROSCOPIC
Bilirubin Urine: NEGATIVE
GLUCOSE, UA: NEGATIVE mg/dL
HGB URINE DIPSTICK: NEGATIVE
Ketones, ur: NEGATIVE mg/dL
LEUKOCYTES UA: NEGATIVE
Nitrite: NEGATIVE
PROTEIN: NEGATIVE mg/dL
pH: 6 (ref 5.0–8.0)

## 2016-09-07 LAB — WET PREP, GENITAL
CLUE CELLS WET PREP: NONE SEEN
SPERM: NONE SEEN
YEAST WET PREP: NONE SEEN

## 2016-09-07 LAB — HCG, QUANTITATIVE, PREGNANCY: HCG, BETA CHAIN, QUANT, S: 167 m[IU]/mL — AB (ref <5)

## 2016-09-07 MED ORDER — PROMETHAZINE HCL 12.5 MG PO TABS: 12.5000 mg | ORAL_TABLET | Freq: Four times a day (QID) | ORAL | 0 refills | Status: DC | PRN

## 2016-09-07 MED ORDER — METRONIDAZOLE 500 MG PO TABS
2000.0000 mg | ORAL_TABLET | Freq: Once | ORAL | Status: AC
  Administered 2016-09-07: 2000 mg via ORAL
  Filled 2016-09-07: qty 4

## 2016-09-07 NOTE — MAU Note (Addendum)
Just found out I am pregnant. LMP 08/13/16. Having cramping lower abd for 4 days. Spotting since this am. Pink spotting. Has had 3 positive upts

## 2016-09-07 NOTE — MAU Provider Note (Signed)
History     CSN: [id number]  Arrival date and time: 09/07/16 1851   First Provider Initiated Contact with Patient 09/07/16 2003      Chief Complaint  Patient presents with  . Vaginal Bleeding  . Abdominal Cramping   HPI  Rachael Mann is a 25 y.o. G4P1021 at [redacted]w[redacted]d by LMP who presents with abdominal cramping & vaginal bleeding. Lower abdominal cramping since yesterday. Reports intermittent cramp like pain across lower abdomen. Rates pain 6/10. Has not treated. Nothing makes better or worse.  Vaginal bleeding since this morning. Pink spotting on toilet paper today. Does not occur every time she goes to the bathroom. White milky discharge in addition to spotting. No odor. No vaginal irritation.  Endorses nausea that is worse at night. No vomiting. Last BM yesterday; denies diarrhea or constipation. Last intercourse was 2 days ago.  Spoke with Dr. Arelia Sneddon earlier today who directed her to MAU.   OB History    Gravida Para Term Preterm AB Living   4 1 1   2 1    SAB TAB Ectopic Multiple Live Births   2       1      Past Medical History:  Diagnosis Date  . Chlamydia   . History of miscarriage     Past Surgical History:  Procedure Laterality Date  . TONSILLECTOMY      Family History  Problem Relation Age of Onset  . Diabetes Maternal Grandmother   . Anesthesia problems Neg Hx   . Other Neg Hx     Social History  Substance Use Topics  . Smoking status: Never Smoker  . Smokeless tobacco: Never Used  . Alcohol use No    Allergies: No Known Allergies  No prescriptions prior to admission.    Review of Systems  Constitutional: Negative for chills and fever.  Gastrointestinal: Positive for abdominal pain and nausea. Negative for constipation, diarrhea and vomiting.  Genitourinary: Negative for dysuria and hematuria.       + vaginal spotting & discharge   Physical Exam   Blood pressure 121/74, pulse 90, temperature 98.5 F (36.9 C), resp. rate 16, height 5\' 7"   (1.702 m), weight 172 lb (78 kg), last menstrual period 08/03/2016, unknown if currently breastfeeding.  Physical Exam  Nursing note and vitals reviewed. Constitutional: She is oriented to person, place, and time. She appears well-developed and well-nourished. No distress.  HENT:  Head: Normocephalic and atraumatic.  Eyes: Conjunctivae are normal. Right eye exhibits no discharge. Left eye exhibits no discharge. No scleral icterus.  Neck: Normal range of motion.  Cardiovascular: Normal rate, regular rhythm and normal heart sounds.   No murmur heard. Respiratory: Effort normal and breath sounds normal. No respiratory distress. She has no wheezes.  GI: Soft. Bowel sounds are normal. She exhibits no distension. There is no tenderness. There is no rebound and no guarding.  Genitourinary: Uterus normal. Cervix exhibits no motion tenderness and no friability. Right adnexum displays no mass and no tenderness. Left adnexum displays no mass and no tenderness. No bleeding in the vagina. Vaginal discharge (small amount of thin white frothy discharge) found.  Genitourinary Comments: Cervix closed  Neurological: She is alert and oriented to person, place, and time.  Skin: Skin is warm and dry. She is not diaphoretic.  Psychiatric: She has a normal mood and affect. Her behavior is normal. Judgment and thought content normal.    MAU Course  Procedures Results for orders placed or performed during the hospital  encounter of 09/07/16 (from the past 24 hour(s))  Urinalysis, [facility]ine w reflex microscopic [facility])     Status: Abnormal   Collection Time: 09/07/16  7:23 PM  Result Value Ref Range   Color, Urine YELLOW YELLOW   APPearance CLEAR CLEAR   Specific Gravity, Urine >1.030 (H) 1.005 - 1.030   pH 6.0 5.0 - 8.0   Glucose, UA NEGATIVE NEGATIVE mg/dL   Hgb urine dipstick NEGATIVE NEGATIVE   Bilirubin Urine NEGATIVE NEGATIVE   Ketones, ur NEGATIVE NEGATIVE mg/dL   Protein, ur NEGATIVE NEGATIVE  mg/dL   Nitrite NEGATIVE NEGATIVE   Leukocytes, UA NEGATIVE NEGATIVE  Pregnancy, urine POC     Status: Abnormal   Collection Time: 09/07/16  7:36 PM  Result Value Ref Range   Preg Test, Ur POSITIVE (A) NEGATIVE  CBC     Status: Abnormal   Collection Time: 09/07/16  8:06 PM  Result Value Ref Range   WBC 5.4 4.0 - 10.5 K/uL   RBC 3.92 3.87 - 5.11 MIL/uL   Hemoglobin 12.1 12.0 - 15.0 g/dL   HCT 25.335.0 (L) 66.436.0 - 40.346.0 %   MCV 89.3 78.0 - 100.0 fL   MCH 30.9 26.0 - 34.0 pg   MCHC 34.6 30.0 - 36.0 g/dL   RDW 47.412.0 25.911.5 - 56.315.5 %   Platelets 253 150 - 400 K/uL  hCG, quantitative, pregnancy     Status: Abnormal   Collection Time: 09/07/16  8:06 PM  Result Value Ref Range   hCG, Beta Chain, Quant, S 167 (H) <5 mIU/mL  Wet prep, genital     Status: Abnormal   Collection Time: 09/07/16  8:40 PM  Result Value Ref Range   Yeast Wet Prep HPF POC NONE SEEN NONE SEEN   Trich, Wet Prep PRESENT (A) NONE SEEN   Clue Cells Wet Prep HPF POC NONE SEEN NONE SEEN   WBC, Wet Prep HPF POC MODERATE (A) NONE SEEN   Sperm NONE SEEN    [place] Ob Comp Less 14 Wks  Result Date: 09/07/2016 CLINICAL DATA:  Pelvic pain with spotting for 4 days EXAM: OBSTETRIC <14 WK US AND TRANSVAGINAL OB US TECHNIQUE: Both transabdominal and transvaginal ultrasound examinations were performed for complete evaluation of the gestation as well as the maternal uterus, adnexal regions, and pelvic cul-de-sac. Transvaginal technique was performed to assess early pregnancy. COMPARISON:  None. FINDINGS: Intrauterine gestational sac: Not visualized Yolk sac:  Not visualized Embryo:  Not visualized Cardiac Activity: Not visualized Maternal uterus/adnexae: There are no focal myometrial abnormalities. The ovaries are within normal limits. The right ovary measures 2 x 2.7 x 1.6 cm. The left ovary measures 1.9 by 3.5 x 1.9 cm. There is a small amount of free fluid. IMPRESSION: No intrauterine gestational sac, yolk sac or fetal pole is identified.  Correlation with quantitative beta HCG is at advised. Follow-up ultrasound may be performed as clinically indicated. Electronically Signed   By: Jasmine PangKim  Fujinaga M.D.   On: 09/07/2016 20:37   [place] Ob Transvaginal  Result Date: 09/07/2016 CLINICAL DATA:  Pelvic pain with spotting for 4 days EXAM: OBSTETRIC <14 WK US AND TRANSVAGINAL OB US TECHNIQUE: Both transabdominal and transvaginal ultrasound examinations were performed for complete evaluation of the gestation as well as the maternal uterus, adnexal regions, and pelvic cul-de-sac. Transvaginal technique was performed to assess early pregnancy. COMPARISON:  None. FINDINGS: Intrauterine gestational sac: Not visualized Yolk sac:  Not visualized Embryo:  Not visualized Cardiac Activity: Not visualized Maternal uterus/adnexae: There  are no focal myometrial abnormalities. The ovaries are within normal limits. The right ovary measures 2 x 2.7 x 1.6 cm. The left ovary measures 1.9 by 3.5 x 1.9 cm. There is a small amount of free fluid. IMPRESSION: No intrauterine gestational sac, yolk sac or fetal pole is identified. Correlation with quantitative beta HCG is at advised. Follow-up ultrasound may be performed as clinically indicated. Electronically Signed   By: Jasmine PangKim  Fujinaga M.D.   On: 09/07/2016 20:37     MDM +UPT UA, wet prep, GC/chlamydia, CBC, ABO/Rh, quant hCG, HIV, and US today to rule out ectopic pregnancy A positive BHCG 167 -- ultrasound shows no iup or adnexal mass Flagyl 2 gm PO for trich tx S/w Dr. Renaldo FiddlerAdkins -- will discharge home for pt to f/u in MAU on Sunday for BHCG  This abdominal pain/vaginal bleeding could represent a normal pregnancy, spontaneous abortion, or even an ectopic pregnancy which can be life-threatening. Cultures were obtained to rule out pelvic infection.    Assessment and Plan  A: 1. Pregnancy of unknown anatomic location   2. Vaginal bleeding in pregnancy, first trimester   3. Abdominal pain affecting pregnancy   4.  Trichomonas infection    P: Discharge home Rx phenergan Expedited partner tx & info sheet given to pt for spouse No intercourse x 2 weeks after both have been treated Discussed reasons to return to MAU including s/s of ectopic [facility]  Judeth Hornrin Ashe Gago 09/07/2016, 8:03 PM

## 2016-09-07 NOTE — Discharge Instructions (Signed)
Vaginal Bleeding During Pregnancy, First Trimester A small amount of bleeding (spotting) from the vagina is common in early pregnancy. Sometimes the bleeding is normal and is not a problem, and sometimes it is a sign of something serious. Be sure to tell your doctor about any bleeding from your vagina right away. HOME CARE  Watch your condition for any changes.  Follow your doctor's instructions about how active you can be.  If you are on bed rest:  You may need to stay in bed and only get up to use the bathroom.  You may be allowed to do some activities.  If you need help, make plans for someone to help you.  Write down:  The number of pads you use each day.  How often you change pads.  How soaked (saturated) your pads are.  Do not use tampons.  Do not douche.  Do not have sex or orgasms until your doctor says it is okay.  If you pass any tissue from your vagina, save the tissue so you can show it to your doctor.  Only take medicines as told by your doctor.  Do not take aspirin because it can make you bleed.  Keep all follow-up visits as told by your doctor. GET HELP IF:   You bleed from your vagina.  You have cramps.  You have labor pains.  You have a fever that does not go away after you take medicine. GET HELP RIGHT AWAY IF:   You have very bad cramps in your back or belly (abdomen).  You pass large clots or tissue from your vagina.  You bleed more.  You feel light-headed or weak.  You pass out (faint).  You have chills.  You are leaking fluid or have a gush of fluid from your vagina.  You pass out while pooping (having a bowel movement). MAKE SURE YOU:  Understand these instructions.  Will watch your condition.  Will get help right away if you are not doing well or get worse.   This information is not intended to replace advice given to you by your health care provider. Make sure you discuss any questions you have with your health care  provider.   Document Released: 03/01/2014 Document Reviewed: 03/01/2014 Elsevier Interactive Patient Education 2016 Elsevier Inc.     Abdominal Pain During Pregnancy Belly (abdominal) pain is common during pregnancy. Most of the time, it is not a serious problem. Other times, it can be a sign that something is wrong with the pregnancy. Always tell your doctor if you have belly pain. HOME CARE Monitor your belly pain for any changes. The following actions may help you feel better:  Do not have sex (intercourse) or put anything in your vagina until you feel better.  Rest until your pain stops.  Drink clear fluids if you feel sick to your stomach (nauseous). Do not eat solid food until you feel better.  Only take medicine as told by your doctor.  Keep all doctor visits as told. GET HELP RIGHT AWAY IF:   You are bleeding, leaking fluid, or pieces of tissue come out of your vagina.  You have more pain or cramping.  You keep throwing up (vomiting).  You have pain when you pee (urinate) or have blood in your pee.  You have a fever.  You do not feel your baby moving as much.  You feel very weak or feel like passing out.  You have trouble breathing, with or without belly pain.  You have a very bad headache and belly pain.  You have fluid leaking from your vagina and belly pain.  You keep having watery poop (diarrhea).  Your belly pain does not go away after resting, or the pain gets worse. MAKE SURE YOU:   Understand these instructions.  Will watch your condition.  Will get help right away if you are not doing well or get worse.   This information is not intended to replace advice given to you by your health care provider. Make sure you discuss any questions you have with your health care provider.   Document Released: 10/03/2009 Document Revised: 06/17/2013 Document Reviewed: 05/14/2013 Elsevier Interactive Patient Education 2016 [employer] Trichomoniasis Trichomoniasis is an infection caused by an organism called Trichomonas. The infection can affect both women and men. In women, the outer female genitalia and the vagina are affected. In men, the penis is mainly affected, but the prostate and other reproductive organs can also be involved. Trichomoniasis is a sexually transmitted infection (STI) and is most often passed to another person through sexual contact.  RISK FACTORS  Having unprotected sexual intercourse.  Having sexual intercourse with an infected partner. SIGNS AND SYMPTOMS  Symptoms of trichomoniasis in women include:  Abnormal gray-green frothy vaginal discharge.  Itching and irritation of the vagina.  Itching and irritation of the area outside the vagina. Symptoms of trichomoniasis in men include:   Penile discharge with or without pain.  Pain during urination. This results from inflammation of the urethra. DIAGNOSIS  Trichomoniasis may be found during a Pap test or physical exam. Your health care provider may use one of the following methods to help diagnose this infection:  Testing the pH of the vagina with a test tape.  Using a vaginal swab test that checks for the Trichomonas organism. A test is available that provides results within a few minutes.  Examining a urine sample.  Testing vaginal secretions. Your health care provider may test you for other STIs, including HIV. TREATMENT   You may be given medicine to fight the infection. Women should inform their health care provider if they could be or are pregnant. Some medicines used to treat the infection should not be taken during pregnancy.  Your health care provider may recommend over-the-counter medicines or creams to decrease itching or irritation.  Your sexual partner will need to be treated if infected.  Your health care provider may test you for infection again 3 months after treatment. HOME CARE INSTRUCTIONS   Take medicines  only as directed by your health care provider.  Take over-the-counter medicine for itching or irritation as directed by your health care provider.  Do not have sexual intercourse while you have the infection.  Women should not douche or wear tampons while they have the infection.  Discuss your infection with your partner. Your partner may have gotten the infection from you, or you may have gotten it from your partner.  Have your sex partner get examined and treated if necessary.  Practice safe, informed, and protected sex.  See your health care provider for other STI testing. SEEK MEDICAL CARE IF:   You still have symptoms after you finish your medicine.  You develop abdominal pain.  You have pain when you urinate.  You have bleeding after sexual intercourse.  You develop a rash.  Your medicine makes you sick or makes you throw up (vomit). MAKE SURE YOU:  Understand these instructions.  Will watch your condition.  Will get help  right away if you are not doing well or get worse.   This information is not intended to replace advice given to you by your health care provider. Make sure you discuss any questions you have with your health care provider.   Document Released: 04/10/2001 Document Revised: 11/05/2014 Document Reviewed: 07/27/2013 Elsevier Interactive Patient Education [employer]2016 Elsevier Inc.

## 2016-09-08 LAB — HIV ANTIBODY (ROUTINE TESTING W REFLEX): HIV SCREEN 4TH GENERATION: NONREACTIVE

## 2016-09-09 ENCOUNTER — Inpatient Hospital Stay (HOSPITAL_COMMUNITY)
Admission: AD | Admit: 2016-09-09 | Discharge: 2016-09-09 | Disposition: A | Discharge status: Home / Self Care | Payer: 59 | Source: Ambulatory Visit | Attending: Obstetrics & Gynecology | Admitting: Obstetrics & Gynecology

## 2016-09-09 DIAGNOSIS — O26891 Other specified pregnancy related conditions, first trimester: Secondary | ICD-10-CM | POA: Insufficient documentation

## 2016-09-09 DIAGNOSIS — O9989 Other specified diseases and conditions complicating pregnancy, childbirth and the puerperium: Secondary | ICD-10-CM | POA: Diagnosis not present

## 2016-09-09 DIAGNOSIS — O209 Hemorrhage in early pregnancy, unspecified: Secondary | ICD-10-CM | POA: Diagnosis not present

## 2016-09-09 DIAGNOSIS — Z3A01 Less than 8 weeks gestation of pregnancy: Secondary | ICD-10-CM | POA: Insufficient documentation

## 2016-09-09 DIAGNOSIS — R109 Unspecified abdominal pain: Secondary | ICD-10-CM | POA: Diagnosis not present

## 2016-09-09 DIAGNOSIS — O3680X0 Pregnancy with inconclusive fetal viability, not applicable or unspecified: Secondary | ICD-10-CM

## 2016-09-09 LAB — HCG, QUANTITATIVE, PREGNANCY: hCG, Beta Chain, Quant, S: 319 m[IU]/mL — ABNORMAL HIGH (ref <5)

## 2016-09-09 NOTE — MAU Provider Note (Signed)
History   [id number]   Chief Complaint  Patient presents with  . Follow-up    HPI Rachael Mann is a 25 y.o. female [phone] here for follow-up BHCG.  Upon review of the records patient was first seen on 09/07/16 for abdominal cramping and vaginal bleeding.   BHCG on that day was 167.  Ultrasound showed No intrauterine gestational sac, yolk sac or fetal pole is identified.Marland Kitchen.  GC/CT and wet prep were collected.  Results were + trich, given partner treatment.   GC/CT pending.   Pt here today with no report of abdominal pain or vaginal bleeding.   All other systems negative.   Patient's last menstrual period was 08/03/2016.  OB History  Gravida Para Term Preterm AB Living  4 1 1   2 1   SAB TAB Ectopic Multiple Live Births  2       1    # Outcome Date GA Lbr Len/2nd Weight Sex Delivery Anes PTL Lv  4 Current           3 Term 02/05/13 5950w5d 11:55 / 02:56 7 lb 10.2 oz (3.464 kg) F Vag-Vacuum EPI  LIV  2 SAB           1 SAB               Past Medical History:  Diagnosis Date  . Chlamydia   . History of miscarriage     Family History  Problem Relation Age of Onset  . Diabetes Maternal Grandmother   . Anesthesia problems Neg Hx   . Other Neg Hx     Social History   Social History  . Marital status: Single    Spouse name: N/A  . Number of children: N/A  . Years of education: N/A   Social History Main Topics  . Smoking status: Never Smoker  . Smokeless tobacco: Never Used  . Alcohol use No  . Drug use: No  . Sexual activity: Yes    Birth control/ protection: None   Other Topics Concern  . Not on file   Social History Narrative  . No narrative on file    No Known Allergies  No current facility-administered medications on file prior to encounter.    Current Outpatient Prescriptions on File Prior to Encounter  Medication Sig Dispense Refill  . promethazine (PHENERGAN) 12.5 MG tablet Take 1 tablet (12.5 mg total) by mouth every 6 (six) hours as needed for  nausea or vomiting. 30 tablet 0     Physical Exam   Vitals:   09/09/16 2006  BP: 121/65  Pulse: 86  Resp: 18  Temp: 98 F (36.7 C)  TempSrc: Oral    Physical Exam  Constitutional: She is oriented to person, place, and time. She appears well-developed and well-nourished. No distress.  HENT:  Head: Normocephalic.  Neck: Neck supple.  Respiratory: Effort normal and breath sounds normal.  Neurological: She is alert and oriented to person, place, and time. She has normal reflexes.  Skin: Skin is warm and dry.  Psychiatric: She has a normal mood and affect.    MAU Course  Procedures  MDM Results for orders placed or performed during the hospital encounter of 09/09/16 (from the past 24 hour(s))  hCG, quantitative, pregnancy     Status: Abnormal   Collection Time: 09/09/16  7:58 PM  Result Value Ref Range   hCG, Beta Chain, Quant, S 319 (H) <5 mIU/mL     Assessment and Plan  25 y.o.  [id number] at 3259w2d wks Pregnancy Pregnancy of Unknown Location  Plan: Ultrasound in 10 days Reviewed ectopic precautions  Marlis EdelsonWalidah N Karim, CNM 09/09/2016 9:14 PM

## 2016-09-09 NOTE — MAU Note (Signed)
Pt presents for repeat BHCG. Denies pain or bleeding.

## 2016-09-10 LAB — GC/CHLAMYDIA PROBE AMP (~~LOC~~) NOT AT ARMC
CHLAMYDIA, DNA PROBE: NEGATIVE
NEISSERIA GONORRHEA: NEGATIVE

## 2016-09-14 ENCOUNTER — Other Ambulatory Visit (HOSPITAL_COMMUNITY): Payer: 59

## 2016-09-17 ENCOUNTER — Ambulatory Visit (HOSPITAL_COMMUNITY)
Admission: RE | Admit: 2016-09-17 | Discharge: 2016-09-17 | Disposition: A | Discharge status: Home / Self Care | Payer: 59 | Source: Ambulatory Visit | Attending: Family | Admitting: Family

## 2016-09-17 ENCOUNTER — Ambulatory Visit: Payer: 59

## 2016-09-17 DIAGNOSIS — O3680X0 Pregnancy with inconclusive fetal viability, not applicable or unspecified: Secondary | ICD-10-CM

## 2016-09-17 DIAGNOSIS — Z3A01 Less than 8 weeks gestation of pregnancy: Secondary | ICD-10-CM | POA: Insufficient documentation

## 2016-09-17 DIAGNOSIS — Z3689 Encounter for other specified antenatal screening: Secondary | ICD-10-CM | POA: Insufficient documentation

## 2016-09-17 DIAGNOSIS — Z3200 Encounter for pregnancy test, result unknown: Secondary | ICD-10-CM

## 2016-09-17 NOTE — Progress Notes (Signed)
Pt here today for OB US results.  Notified Dr. Debroah LoopArnold of pt results.  Providers recommendation is to schedule f/u OB US scheduled in 14 days.  Scheduled December 6th @ 1000.  Was informed by the front office that pt had to leave.    Contacted pt and informed her of US results and her US appt.  Pt stated understanding.

## 2016-09-19 ENCOUNTER — Encounter: Payer: Self-pay | Admitting: Family Medicine

## 2016-10-03 ENCOUNTER — Ambulatory Visit (HOSPITAL_COMMUNITY): Payer: 59

## 2017-07-13 ENCOUNTER — Encounter (HOSPITAL_COMMUNITY): Payer: Self-pay

## 2017-12-22 ENCOUNTER — Encounter (HOSPITAL_COMMUNITY): Payer: Self-pay

## 2017-12-22 ENCOUNTER — Emergency Department (HOSPITAL_COMMUNITY)
Admission: EM | Admit: 2017-12-22 | Discharge: 2017-12-22 | Disposition: A | Discharge status: Home / Self Care | Payer: 59 | Attending: Emergency Medicine | Admitting: Emergency Medicine

## 2017-12-22 DIAGNOSIS — J020 Streptococcal pharyngitis: Secondary | ICD-10-CM

## 2017-12-22 DIAGNOSIS — J029 Acute pharyngitis, unspecified: Secondary | ICD-10-CM | POA: Diagnosis present

## 2017-12-22 LAB — RAPID STREP SCREEN (MED CTR MEBANE ONLY): Streptococcus, Group A Screen (Direct): POSITIVE — AB

## 2017-12-22 MED ORDER — DEXAMETHASONE 4 MG PO TABS
10.0000 mg | ORAL_TABLET | Freq: Once | ORAL | Status: AC
  Administered 2017-12-22: 10 mg via ORAL
  Filled 2017-12-22: qty 3

## 2017-12-22 MED ORDER — ACETAMINOPHEN 325 MG PO TABS
650.0000 mg | ORAL_TABLET | Freq: Once | ORAL | Status: AC | PRN
  Administered 2017-12-22: 650 mg via ORAL
  Filled 2017-12-22: qty 2

## 2017-12-22 MED ORDER — PENICILLIN G BENZATHINE 1200000 UNIT/2ML IM SUSP
1.2000 10*6.[IU] | Freq: Once | INTRAMUSCULAR | Status: AC
  Administered 2017-12-22: 1.2 10*6.[IU] via INTRAMUSCULAR
  Filled 2017-12-22: qty 2

## 2017-12-22 NOTE — Discharge Instructions (Signed)
Continue to take medication for your fever and pain. Follow up with your doctor. Return here for worsening symptoms such as difficulty swallowing.

## 2017-12-22 NOTE — ED Provider Notes (Signed)
[facility]   CSN: [id number] Arrival date & time: 12/22/17  1331     History   Chief Complaint Chief Complaint  Patient presents with  . Sore Throat    HPI Rachael Mann is a 27 y.o. female who presents to the ED for a sore throat. The sore throat started 2 days ago with fever, headache and body aches. Patient denies cough or congestion.   HPI  Past Medical History:  Diagnosis Date  . Chlamydia   . History of miscarriage     There are no active problems to display for this patient.   Past Surgical History:  Procedure Laterality Date  . TONSILLECTOMY      OB History    Gravida Para Term Preterm AB Living   4 1 1   2 1    SAB TAB Ectopic Multiple Live Births   2       1       Home Medications    Prior to Admission medications   Medication Sig Start Date End Date Taking? Authorizing Provider  promethazine (PHENERGAN) 12.5 MG tablet Take 1 tablet (12.5 mg total) by mouth every 6 (six) hours as needed for nausea or vomiting. 09/07/16   Judeth HornLawrence, Erin, NP    Family History Family History  Problem Relation Age of Onset  . Diabetes Maternal Grandmother   . Anesthesia problems Neg Hx   . Other Neg Hx     Social History Social History   Tobacco Use  . Smoking status: Never Smoker  . Smokeless tobacco: Never Used  Substance Use Topics  . Alcohol use: No    Alcohol/week: 0.0 oz  . Drug use: No     Allergies   Patient has no known allergies.   Review of Systems Review of Systems  Constitutional: Positive for chills and fever.  HENT: Positive for sore throat. Negative for ear pain and trouble swallowing.   Eyes: Negative for redness.  Respiratory: Negative for cough, shortness of breath and wheezing.   Cardiovascular: Negative for chest pain.  Gastrointestinal: Negative for abdominal pain, nausea and vomiting.  Musculoskeletal: Positive for myalgias.  Skin: Negative for rash.    Neurological: Positive for headaches. Negative for syncope.  Hematological: Positive for adenopathy.  Psychiatric/Behavioral: Negative for confusion.     Physical Exam Updated Vital Signs BP 112/67 (BP Location: Right Arm)   Pulse 97   Temp 99.9 F (37.7 C) (Oral)   Resp 16   SpO2 98%   Physical Exam  Constitutional: She appears well-developed and well-nourished. No distress.  HENT:  Head: Normocephalic and atraumatic.  Right Ear: External ear normal.  Left Ear: External ear normal.  Nose: Nose normal.  Mouth/Throat: Uvula is midline and mucous membranes are normal. Oropharyngeal exudate and posterior oropharyngeal erythema present. No tonsillar abscesses.  Right tonsil enlarged with exudate but uvula midline, patient without difficulty swallowing.   Eyes: Conjunctivae and EOM are normal.  Neck: Normal range of motion. Neck supple.  Cardiovascular: Tachycardia present.  Pulmonary/Chest: Effort normal.  Abdominal: Soft. There is no tenderness.  Musculoskeletal: Normal range of motion.  Lymphadenopathy:    She has cervical adenopathy.  Neurological: She is alert.  Skin: Skin is warm and dry.  Psychiatric: She has a normal mood and affect.  Nursing note and vitals reviewed.    ED Treatments / Results  Labs (all labs ordered are listed, but only abnormal results are displayed) Labs Reviewed  RAPID STREP  SCREEN (NOT AT [facility]) - Abnormal; Notable for the following components:      Result Value   Streptococcus, Group A Screen (Direct) POSITIVE (*)    All other components within normal limits   Radiology No results found.  Procedures Procedures (including critical care time)  Medications Ordered in ED Medications  penicillin g benzathine (BICILLIN LA) 1200000 UNIT/2ML injection 1.2 Million Units (not administered)  dexamethasone (DECADRON) tablet 10 mg (not administered)  acetaminophen (TYLENOL) tablet 650 mg (650 mg Oral Given 12/22/17 1342)     Initial  Impression / Assessment and Plan / ED Course  I have reviewed the triage vital signs and the nursing notes. Pt febrile with tonsillar exudate, cervical lymphadenopathy, & dysphagia; diagnosis of bacterial pharyngitis. Treated in the ED with steroids, and PCN IM.  Pt appears mildly dehydrated, discussed importance of water rehydration. Presentation non concerning for PTA or RPA. No trismus or uvula deviation. Specific return precautions discussed. Pt able to drink water in ED without difficulty with intact air way. Recommended PCP follow up. Discussed in detail return precautions.   Final Clinical Impressions(s) / ED Diagnoses   Final diagnoses:  Strep throat    ED Discharge Orders    None       Kerrie Buffalo [place], [place] 12/22/17 1502    Linwood Dibbles, MD 12/22/17 2209

## 2017-12-22 NOTE — ED Triage Notes (Signed)
Pt presents for evaluation of fever, headache, body aches and sore throat since Friday. Denies meds today.

## 2018-01-25 ENCOUNTER — Encounter (HOSPITAL_COMMUNITY): Payer: Self-pay | Admitting: Emergency Medicine

## 2018-01-25 ENCOUNTER — Emergency Department (HOSPITAL_COMMUNITY)
Admission: EM | Admit: 2018-01-25 | Discharge: 2018-01-25 | Disposition: A | Discharge status: Home / Self Care | Payer: 59 | Attending: Emergency Medicine | Admitting: Emergency Medicine

## 2018-01-25 DIAGNOSIS — J029 Acute pharyngitis, unspecified: Secondary | ICD-10-CM

## 2018-01-25 LAB — RAPID STREP SCREEN (MED CTR MEBANE ONLY): Streptococcus, Group A Screen (Direct): NEGATIVE

## 2018-01-25 NOTE — ED Triage Notes (Signed)
Pt to ER for evaluation of sore throat.x2 days. Denies fever. Swollen, red tonsils noted. Airway intact. VSS.

## 2018-01-25 NOTE — ED Provider Notes (Signed)
[facility] Provider Note   CSN: [id number] Arrival date & time: 01/25/18  [id number]     History   Chief Complaint Chief Complaint  Patient presents with  . Sore Throat    HPI Rachael Mann is a 27 y.o. female presenting to the ED with 2 days of sore throat.  Patient states her throat hurts worse with swallowing, with relief with TheraFlu.  She states it is worse in the morning and the evenings.  She reports associated mild nasal congestion.  Denies fever, difficulty swallowing or breathing, ear pain, cough, or other complaints.  Patient recently had strep throat in February.  The history is provided by the patient.    Past Medical History:  Diagnosis Date  . Chlamydia   . History of miscarriage     There are no active problems to display for this patient.   Past Surgical History:  Procedure Laterality Date  . TONSILLECTOMY       OB History    Gravida  4   Para  1   Term  1   Preterm      AB  2   Living  1     SAB  2   TAB      Ectopic      Multiple      Live Births  1            Home Medications    Prior to Admission medications   Medication Sig Start Date End Date Taking? Authorizing Provider  promethazine (PHENERGAN) 12.5 MG tablet Take 1 tablet (12.5 mg total) by mouth every 6 (six) hours as needed for nausea or vomiting. 09/07/16   Judeth HornLawrence, Erin, NP    Family History Family History  Problem Relation Age of Onset  . Diabetes Maternal Grandmother   . Anesthesia problems Neg Hx   . Other Neg Hx     Social History Social History   Tobacco Use  . Smoking status: Never Smoker  . Smokeless tobacco: Never Used  Substance Use Topics  . Alcohol use: No    Alcohol/week: 0.0 oz  . Drug use: No     Allergies   Patient has no known allergies.   Review of Systems Review of Systems  Constitutional: Negative for chills and fever.  HENT: Positive for congestion and sore throat. Negative for  ear pain, trouble swallowing and voice change.   Respiratory: Negative for cough.   All other systems reviewed and are negative.    Physical Exam Updated Vital Signs BP (!) 110/55 (BP Location: Right Arm)   Pulse 93   Temp 98.8 F (37.1 C) (Oral)   Resp 18   SpO2 100%   Physical Exam  Constitutional: She appears well-developed and well-nourished. She does not appear ill. No distress.  HENT:  Head: Normocephalic and atraumatic.  Right Ear: Tympanic membrane and ear canal normal.  Left Ear: Tympanic membrane and ear canal normal.  Mouth/Throat: Uvula is midline. No trismus in the jaw. No uvula swelling. Posterior oropharyngeal erythema present. No tonsillar exudate.  Oral pharyngeal and tonsillar erythema with mild edema.  No exudates or petechia.  Eyes: Conjunctivae are normal.  Neck: Normal range of motion. Neck supple.  Cardiovascular: Normal rate, regular rhythm, normal heart sounds and intact distal pulses.  Pulmonary/Chest: Effort normal and breath sounds normal. No respiratory distress.  Lymphadenopathy:    She has cervical adenopathy (Mild anterior).  Psychiatric: She has a normal mood and  affect. Her behavior is normal.  Nursing note and vitals reviewed.    ED Treatments / Results  Labs (all labs ordered are listed, but only abnormal results are displayed) Labs Reviewed  RAPID STREP SCREEN (NOT AT [facility])  CULTURE, GROUP A STREP [facility])    EKG None  Radiology No results found.  Procedures Procedures (including critical care time)  Medications Ordered in ED Medications - No data to display   Initial Impression / Assessment and Plan / ED Course  I have reviewed the triage vital signs and the nursing notes.  Pertinent labs & imaging results that were available during my care of the patient were reviewed by me and considered in my medical decision making (see chart for details).     Pt afebrile without tonsillar exudate, negative strep. Presents with mild  cervical lymphadenopathy, & dysphagia; diagnosis of viral pharyngitis. No abx indicated. DC w symptomatic tx for pain  Pt does not appear dehydrated, but did discuss importance of water rehydration. Presentation non concerning for PTA or infxn spread to soft tissue. No trismus or uvula deviation. Specific return precautions discussed. Pt able to drink water in ED without difficulty with intact air way. Recommended PCP follow up.  Discussed results, findings, treatment and follow up. Patient advised of return precautions. Patient verbalized understanding and agreed with plan.  Final Clinical Impressions(s) / ED Diagnoses   Final diagnoses:  Viral pharyngitis    ED Discharge Orders    None       Robinson, [place] N, PA-C 01/25/18 1205    Doug Sou, MD 01/25/18 [phone]

## 2018-01-25 NOTE — Discharge Instructions (Signed)
Please read instructions below. You can take tylenol and/or ibuprofen as needed for sore throat or body aches. Drink plenty of water. Follow up with your primary care provider if symptoms persist Return to the ER for difficulty swallowing liquids, difficulty breathing, or new or worsening symptoms.

## 2018-01-27 LAB — CULTURE, GROUP A STREP (THRC)

## 2018-08-22 ENCOUNTER — Emergency Department (HOSPITAL_COMMUNITY)
Admission: EM | Admit: 2018-08-22 | Discharge: 2018-08-23 | Disposition: A | Discharge status: Home / Self Care | Payer: Medicaid Other | Attending: Emergency Medicine | Admitting: Emergency Medicine

## 2018-08-22 ENCOUNTER — Encounter (HOSPITAL_COMMUNITY): Payer: Self-pay | Admitting: Emergency Medicine

## 2018-08-22 ENCOUNTER — Other Ambulatory Visit: Payer: Self-pay

## 2018-08-22 DIAGNOSIS — L02212 Cutaneous abscess of back [any part, except buttock]: Secondary | ICD-10-CM | POA: Diagnosis not present

## 2018-08-22 DIAGNOSIS — L0291 Cutaneous abscess, unspecified: Secondary | ICD-10-CM | POA: Diagnosis present

## 2018-08-22 NOTE — ED Triage Notes (Signed)
Pt reports abscess to R lower back pain since Monday, area is firm and painful to palpation but no head present at site.

## 2018-08-23 MED ORDER — LIDOCAINE-EPINEPHRINE (PF) 2 %-1:200000 IJ SOLN
20.0000 mL | Freq: Once | INTRAMUSCULAR | Status: AC
  Administered 2018-08-23: 20 mL via INTRADERMAL
  Filled 2018-08-23: qty 20

## 2018-08-23 NOTE — Discharge Instructions (Signed)
You can take Tylenol or ibuprofen for pain °Keep wound clean with warm soap and water and keep bandage dry, do not submerge in water for 24 hours. Change bandage sooner if it gets dirty °Return for fever, increased redness, swelling, pain, or worsening drainage ° °

## 2018-08-23 NOTE — ED Provider Notes (Signed)
[facility] EMERGENCY DEPARTMENT Provider Note   CSN: [id number] Arrival date & time: 08/22/18  2204     History   Chief Complaint Chief Complaint  Patient presents with  . Abscess    HPI Rachael Mann is a 27 y.o. female who presents with an abscess.  No significant past medical history.  The patient states that she has had a sore area over the right lower back since Monday.  Today became painful therefore she decided to come to the emergency department.  She has not felt unwell.  No nausea vomiting.  She has never had this before.  The area has not been draining.  HPI  Past Medical History:  Diagnosis Date  . Chlamydia   . History of miscarriage     There are no active problems to display for this patient.   Past Surgical History:  Procedure Laterality Date  . TONSILLECTOMY       OB History    Gravida  4   Para  1   Term  1   Preterm      AB  2   Living  1     SAB  2   TAB      Ectopic      Multiple      Live Births  1            Home Medications    Prior to Admission medications   Medication Sig Start Date End Date Taking? Authorizing Provider  promethazine (PHENERGAN) 12.5 MG tablet Take 1 tablet (12.5 mg total) by mouth every 6 (six) hours as needed for nausea or vomiting. 09/07/16   Judeth Horn, NP    Family History Family History  Problem Relation Age of Onset  . Diabetes Maternal Grandmother   . Anesthesia problems Neg Hx   . Other Neg Hx     Social History Social History   Tobacco Use  . Smoking status: Never Smoker  . Smokeless tobacco: Never Used  Substance Use Topics  . Alcohol use: No  . Drug use: No     Allergies   Penicillins   Review of Systems Review of Systems  Constitutional: Negative for fever.  Skin:       +abscess     Physical Exam Updated Vital Signs BP (!) 107/58 (BP Location: Right Arm)   Pulse 75   Temp 98.3 F (36.8 C) (Oral)   Resp 16   Ht 5' 7.5" (1.715  m)   Wt 73.9 kg   LMP 08/11/2018 (Exact Date)   SpO2 98%   BMI 25.15 kg/m   Physical Exam  Constitutional: She is oriented to person, place, and time. She appears well-developed and well-nourished. No distress.  HENT:  Head: Normocephalic and atraumatic.  Eyes: Pupils are equal, round, and reactive to light. Conjunctivae are normal. Right eye exhibits no discharge. Left eye exhibits no discharge. No scleral icterus.  Neck: Normal range of motion.  Cardiovascular: Normal rate.  Pulmonary/Chest: Effort normal. No respiratory distress.  Abdominal: She exhibits no distension.  Neurological: She is alert and oriented to person, place, and time.  Skin: Skin is warm and dry.  Erythematous, indurated abscess with central scab over the right lower back which is tender to palpation. No fluctuance or drainage.  Psychiatric: She has a normal mood and affect. Her behavior is normal.  Nursing note and vitals reviewed.    ED Treatments / Results  Labs (all labs ordered are listed,  but only abnormal results are displayed) Labs Reviewed - No data to display  EKG None  Radiology No results found.  Procedures .Marland KitchenIncision and Drainage Date/Time: 08/23/2018 3:29 PM Performed by: Bethel Born, PA-C Authorized by: Bethel Born, PA-C   Consent:    Consent obtained:  Verbal   Consent given by:  Patient   Risks discussed:  Bleeding, incomplete drainage, pain and damage to other organs   Alternatives discussed:  No treatment Universal protocol:    Procedure explained and questions answered to patient or proxy's satisfaction: yes     Relevant documents present and verified: yes     Test results available and properly labeled: yes     Imaging studies available: yes     Required blood products, implants, devices, and special equipment available: yes     Site/side marked: yes     Immediately prior to procedure a time out was called: yes     Patient identity confirmed:  Verbally with  patient Location:    Type:  Abscess   Size:  3x3   Location: right lower back. Pre-procedure details:    Skin preparation:  Betadine Anesthesia (see MAR for exact dosages):    Anesthesia method:  Local infiltration   Local anesthetic:  Lidocaine 2% WITH epi Procedure type:    Complexity:  Simple Procedure details:    Incision types:  Single straight   Incision depth:  Subcutaneous   Scalpel blade:  11   Wound management:  Probed and deloculated, irrigated with saline and extensive cleaning   Drainage:  Purulent   Drainage amount:  Moderate   Packing materials:  None Post-procedure details:    Patient tolerance of procedure:  Tolerated well, no immediate complications   (including critical care time)    Medications Ordered in ED Medications - No data to display   Initial Impression / Assessment and Plan / ED Course  I have reviewed the triage vital signs and the nursing notes.  Pertinent labs & imaging results that were available during my care of the patient were reviewed by me and considered in my medical decision making (see chart for details).  27 year old presents with a simple abscess which is amenable to I&D. I&D performed and patient tolerated well. Patient is afebrile. Discussed wound care and signs of worsening infection (fever, chills, increasing pain, redness, or drainage at site). Return precautions given.   Final Clinical Impressions(s) / ED Diagnoses   Final diagnoses:  Abscess of lower back    ED Discharge Orders    None       Bethel Born, PA-C 08/23/18 1530    Donnetta Hutching, MD 08/23/18 [phone]

## 2018-11-03 ENCOUNTER — Other Ambulatory Visit: Payer: Self-pay

## 2018-11-03 ENCOUNTER — Emergency Department (HOSPITAL_BASED_OUTPATIENT_CLINIC_OR_DEPARTMENT_OTHER)
Admission: EM | Admit: 2018-11-03 | Discharge: 2018-11-03 | Disposition: A | Discharge status: Home / Self Care | Payer: Medicaid Other | Attending: Emergency Medicine | Admitting: Emergency Medicine

## 2018-11-03 ENCOUNTER — Encounter (HOSPITAL_BASED_OUTPATIENT_CLINIC_OR_DEPARTMENT_OTHER): Payer: Self-pay | Admitting: Emergency Medicine

## 2018-11-03 DIAGNOSIS — Z202 Contact with and (suspected) exposure to infections with a predominantly sexual mode of transmission: Secondary | ICD-10-CM | POA: Insufficient documentation

## 2018-11-03 DIAGNOSIS — N760 Acute vaginitis: Secondary | ICD-10-CM | POA: Diagnosis not present

## 2018-11-03 DIAGNOSIS — N611 Abscess of the breast and nipple: Secondary | ICD-10-CM | POA: Diagnosis not present

## 2018-11-03 DIAGNOSIS — N644 Mastodynia: Secondary | ICD-10-CM | POA: Diagnosis present

## 2018-11-03 DIAGNOSIS — B9689 Other specified bacterial agents as the cause of diseases classified elsewhere: Secondary | ICD-10-CM

## 2018-11-03 LAB — URINALYSIS, ROUTINE W REFLEX MICROSCOPIC
Bilirubin Urine: NEGATIVE
Glucose, UA: NEGATIVE mg/dL
HGB URINE DIPSTICK: NEGATIVE
Ketones, ur: NEGATIVE mg/dL
LEUKOCYTES UA: NEGATIVE
Nitrite: NEGATIVE
PROTEIN: NEGATIVE mg/dL
SPECIFIC GRAVITY, URINE: 1.02 (ref 1.005–1.030)
pH: 7.5 (ref 5.0–8.0)

## 2018-11-03 LAB — WET PREP, GENITAL
Sperm: NONE SEEN
Trich, Wet Prep: NONE SEEN
Yeast Wet Prep HPF POC: NONE SEEN

## 2018-11-03 LAB — PREGNANCY, URINE: PREG TEST UR: NEGATIVE

## 2018-11-03 MED ORDER — SULFAMETHOXAZOLE-TRIMETHOPRIM 800-160 MG PO TABS: 1.0000 | ORAL_TABLET | Freq: Two times a day (BID) | ORAL | 0 refills | Status: AC

## 2018-11-03 MED ORDER — SULFAMETHOXAZOLE-TRIMETHOPRIM 800-160 MG PO TABS
1.0000 | ORAL_TABLET | Freq: Once | ORAL | Status: AC
  Administered 2018-11-03: 1 via ORAL
  Filled 2018-11-03: qty 1

## 2018-11-03 MED ORDER — METRONIDAZOLE 500 MG PO TABS
500.0000 mg | ORAL_TABLET | Freq: Once | ORAL | Status: AC
  Administered 2018-11-03: 500 mg via ORAL
  Filled 2018-11-03: qty 1

## 2018-11-03 MED ORDER — METRONIDAZOLE 500 MG PO TABS: 500.0000 mg | ORAL_TABLET | Freq: Two times a day (BID) | ORAL | 0 refills | Status: DC

## 2018-11-03 NOTE — Discharge Instructions (Addendum)
Remove nipple ring.

## 2018-11-03 NOTE — ED Notes (Signed)
Pt verbalizes understanding of d/c instructions and denies any further needs at this time. 

## 2018-11-03 NOTE — ED Provider Notes (Signed)
[facility] EMERGENCY DEPARTMENT Provider Note   CSN: [id number] Arrival date & time: 11/03/18  1519     History   Chief Complaint Chief Complaint  Patient presents with  . Abscess  . Exposure to STD    HPI Rachael Mann is a 28 y.o. female.  Pt presents to the ED today with right breast pain and vaginal d/c.  The pt does have her nipples pierced and felt a swelling behind her nipple.  It has enlarged and has gotten more swollen.  The pt also wants to be checked for a STD.  She has an IUD in place.     Past Medical History:  Diagnosis Date  . Chlamydia   . History of miscarriage     There are no active problems to display for this patient.   Past Surgical History:  Procedure Laterality Date  . TONSILLECTOMY       OB History    Gravida  4   Para  1   Term  1   Preterm      AB  2   Living  1     SAB  2   TAB      Ectopic      Multiple      Live Births  1            Home Medications    Prior to Admission medications   Medication Sig Start Date End Date Taking? Authorizing Provider  metroNIDAZOLE (FLAGYL) 500 MG tablet Take 1 tablet (500 mg total) by mouth 2 (two) times daily. 11/03/18   Jacalyn Lefevre, MD  promethazine (PHENERGAN) 12.5 MG tablet Take 1 tablet (12.5 mg total) by mouth every 6 (six) hours as needed for nausea or vomiting. 09/07/16   Judeth Horn, NP  sulfamethoxazole-trimethoprim (BACTRIM DS,SEPTRA DS) 800-160 MG tablet Take 1 tablet by mouth 2 (two) times daily for 7 days. 11/03/18 11/10/18  Jacalyn Lefevre, MD    Family History Family History  Problem Relation Age of Onset  . Diabetes Maternal Grandmother   . Anesthesia problems Neg Hx   . Other Neg Hx     Social History Social History   Tobacco Use  . Smoking status: Never Smoker  . Smokeless tobacco: Never Used  Substance Use Topics  . Alcohol use: No  . Drug use: No     Allergies   Penicillins   Review of Systems Review of Systems    Genitourinary: Positive for vaginal discharge.  Skin:       Right nipple pain/swelling  All other systems reviewed and are negative.    Physical Exam Updated Vital Signs BP 120/60 (BP Location: Right Arm)   Pulse 73   Temp 98.8 F (37.1 C) (Oral)   Resp 16   Ht 5\' 7"  (1.702 m)   Wt 73.9 kg   SpO2 100%   BMI 25.53 kg/m   Physical Exam Vitals signs and nursing note reviewed.  Constitutional:      Appearance: Normal appearance. She is normal weight.  HENT:     Head: Normocephalic and atraumatic.     Right Ear: External ear normal.     Left Ear: External ear normal.     Nose: Nose normal.     Mouth/Throat:     Mouth: Mucous membranes are moist.  Eyes:     Extraocular Movements: Extraocular movements intact.     Conjunctiva/sclera: Conjunctivae normal.     Pupils: Pupils are equal, round, and  reactive to light.  Neck:     Musculoskeletal: Normal range of motion and neck supple.  Cardiovascular:     Rate and Rhythm: Normal rate and regular rhythm.     Pulses: Normal pulses.     Heart sounds: Normal heart sounds.  Pulmonary:     Effort: Pulmonary effort is normal.     Breath sounds: Normal breath sounds.  Chest:     Comments: Pain and swelling behind right nipple.  D/c from nipple ring. Abdominal:     General: Abdomen is flat. Bowel sounds are normal.     Palpations: Abdomen is soft.  Genitourinary:    Vagina: Vaginal discharge present.     Cervix: Discharge present.     Uterus: Normal.      Adnexa: Right adnexa normal and left adnexa normal.  Musculoskeletal: Normal range of motion.  Neurological:     Mental Status: She is alert.      ED Treatments / Results  Labs (all labs ordered are listed, but only abnormal results are displayed) Labs Reviewed  WET PREP, GENITAL - Abnormal; Notable for the following components:      Result Value   Clue Cells Wet Prep HPF POC PRESENT (*)    WBC, Wet Prep HPF POC MANY (*)    All other components within normal limits   URINALYSIS, ROUTINE W REFLEX MICROSCOPIC  PREGNANCY, URINE  GC/CHLAMYDIA PROBE AMP () NOT AT [facility]    EKG None  Radiology No results found.  Procedures Procedures (including critical care time)  Medications Ordered in ED Medications  sulfamethoxazole-trimethoprim (BACTRIM DS,SEPTRA DS) 800-160 MG per tablet 1 tablet (has no administration in time range)  metroNIDAZOLE (FLAGYL) tablet 500 mg (has no administration in time range)     Initial Impression / Assessment and Plan / ED Course  I have reviewed the triage vital signs and the nursing notes.  Pertinent labs & imaging results that were available during my care of the patient were reviewed by me and considered in my medical decision making (see chart for details).    Pt wishes not to be empirically treated for STD.  She will be treated with flagyl for the BV and with bactrim for the nipple abscess.  It is too deep for me to get to in the ED.  The pt is given info for the breast center.  She knows to return if worse.  Final Clinical Impressions(s) / ED Diagnoses   Final diagnoses:  Breast abscess  BV (bacterial vaginosis)    ED Discharge Orders         Ordered    Ambulatory referral to Breast Clinic     11/03/18 1850    sulfamethoxazole-trimethoprim (BACTRIM DS,SEPTRA DS) 800-160 MG tablet  2 times daily     11/03/18 1851    metroNIDAZOLE (FLAGYL) 500 MG tablet  2 times daily     11/03/18 1851           Jacalyn Lefevre, MD 11/03/18 1853

## 2018-11-03 NOTE — ED Triage Notes (Addendum)
Reports abscess to right nipple since yesterday. Draining at present.  Additionally reports to ER for STD check.  Reports vaginal discharge.

## 2018-11-03 NOTE — ED Notes (Signed)
ED Provider at bedside. 

## 2018-11-04 LAB — GC/CHLAMYDIA PROBE AMP (~~LOC~~) NOT AT ARMC
CHLAMYDIA, DNA PROBE: NEGATIVE
Neisseria Gonorrhea: NEGATIVE

## 2018-11-14 ENCOUNTER — Encounter (HOSPITAL_COMMUNITY): Payer: Self-pay | Admitting: Emergency Medicine

## 2018-11-14 ENCOUNTER — Emergency Department (HOSPITAL_COMMUNITY)
Admission: EM | Admit: 2018-11-14 | Discharge: 2018-11-14 | Disposition: A | Discharge status: Home / Self Care | Payer: Medicaid Other | Attending: Emergency Medicine | Admitting: Emergency Medicine

## 2018-11-14 ENCOUNTER — Telehealth: Payer: Self-pay | Admitting: Emergency Medicine

## 2018-11-14 DIAGNOSIS — Z79899 Other long term (current) drug therapy: Secondary | ICD-10-CM | POA: Insufficient documentation

## 2018-11-14 DIAGNOSIS — L0291 Cutaneous abscess, unspecified: Secondary | ICD-10-CM

## 2018-11-14 DIAGNOSIS — N644 Mastodynia: Secondary | ICD-10-CM | POA: Diagnosis present

## 2018-11-14 DIAGNOSIS — N611 Abscess of the breast and nipple: Secondary | ICD-10-CM | POA: Diagnosis not present

## 2018-11-14 LAB — CBC WITH DIFFERENTIAL/PLATELET
ABS IMMATURE GRANULOCYTES: 0.02 10*3/uL (ref 0.00–0.07)
Basophils Absolute: 0 10*3/uL (ref 0.0–0.1)
Basophils Relative: 0 %
Eosinophils Absolute: 0.1 10*3/uL (ref 0.0–0.5)
Eosinophils Relative: 1 %
HEMATOCRIT: 41.2 % (ref 36.0–46.0)
HEMOGLOBIN: 13.1 g/dL (ref 12.0–15.0)
IMMATURE GRANULOCYTES: 0 %
LYMPHS ABS: 1.5 10*3/uL (ref 0.7–4.0)
LYMPHS PCT: 16 %
MCH: 30.8 pg (ref 26.0–34.0)
MCHC: 31.8 g/dL (ref 30.0–36.0)
MCV: 96.7 fL (ref 80.0–100.0)
MONOS PCT: 4 %
Monocytes Absolute: 0.4 10*3/uL (ref 0.1–1.0)
NEUTROS ABS: 7 10*3/uL (ref 1.7–7.7)
NEUTROS PCT: 79 %
PLATELETS: 337 10*3/uL (ref 150–400)
RBC: 4.26 MIL/uL (ref 3.87–5.11)
RDW: 10.8 % — AB (ref 11.5–15.5)
WBC: 8.9 10*3/uL (ref 4.0–10.5)
nRBC: 0 % (ref 0.0–0.2)

## 2018-11-14 LAB — BASIC METABOLIC PANEL
Anion gap: 10 (ref 5–15)
BUN: 7 mg/dL (ref 6–20)
CALCIUM: 9.3 mg/dL (ref 8.9–10.3)
CO2: 25 mmol/L (ref 22–32)
Chloride: 103 mmol/L (ref 98–111)
Creatinine, Ser: 0.85 mg/dL (ref 0.44–1.00)
GFR calc Af Amer: >60 mL/min (ref >60)
Glucose, Bld: 75 mg/dL (ref 70–99)
POTASSIUM: 3.6 mmol/L (ref 3.5–5.1)
SODIUM: 138 mmol/L (ref 135–145)

## 2018-11-14 LAB — POC URINE PREG, ED: Preg Test, Ur: NEGATIVE

## 2018-11-14 LAB — I-STAT CG4 LACTIC ACID, ED: LACTIC ACID, VENOUS: 0.87 mmol/L (ref 0.5–1.9)

## 2018-11-14 MED ORDER — ONDANSETRON HCL 4 MG/2ML IJ SOLN
4.0000 mg | Freq: Once | INTRAMUSCULAR | Status: AC
  Administered 2018-11-14: 4 mg via INTRAVENOUS
  Filled 2018-11-14: qty 2

## 2018-11-14 MED ORDER — CLINDAMYCIN PHOSPHATE 600 MG/50ML IV SOLN
600.0000 mg | Freq: Once | INTRAVENOUS | Status: AC
  Administered 2018-11-14: 600 mg via INTRAVENOUS
  Filled 2018-11-14: qty 50

## 2018-11-14 MED ORDER — SODIUM CHLORIDE 0.9 % IV BOLUS
1000.0000 mL | Freq: Once | INTRAVENOUS | Status: AC
  Administered 2018-11-14: 1000 mL via INTRAVENOUS

## 2018-11-14 MED ORDER — MORPHINE SULFATE (PF) 4 MG/ML IV SOLN
4.0000 mg | Freq: Once | INTRAVENOUS | Status: AC
  Administered 2018-11-14: 4 mg via INTRAVENOUS
  Filled 2018-11-14: qty 1

## 2018-11-14 MED ORDER — TRAMADOL HCL 50 MG PO TABS: 50.0000 mg | ORAL_TABLET | Freq: Four times a day (QID) | ORAL | 0 refills | Status: DC | PRN

## 2018-11-14 NOTE — ED Provider Notes (Signed)
[facility] Provider Note   CSN: [id number] Arrival date & time: 11/14/18  0830     History   Chief Complaint Chief Complaint  Patient presents with  . Abscess    HPI Tally JoeCharnelle C Lucatero is a 28 y.o. female.  The history is provided by the patient and medical records. No language interpreter was used.  Abscess     28 year old female presenting complaining of breast pain.  Patient states she developed infection around her piercing nipple approximately 2 weeks ago.  States that it was draining out purulent material.  She was seen in the ER on 11/03/18 for her complaint.  At which time, was diagnosed with breast abscess and was prescribed Bactrim as treatment and recommendation to come to the breast center for further management.  She did reach out to the breast center but unable to follow-up due to lack of primary care provider.  She continues to endorse progressive worsening pain swelling and redness to her right breast.  Nipple piercing was removed and since then, the wound closed and no discharge was noted but pain became more intense.  He rates pain a 7 out of 10, she endorsed feeling fever and chills.  She took her medication without relief.  She denies any shortness of breath.  Past Medical History:  Diagnosis Date  . Chlamydia   . History of miscarriage     There are no active problems to display for this patient.   Past Surgical History:  Procedure Laterality Date  . TONSILLECTOMY       OB History    Gravida  4   Para  1   Term  1   Preterm      AB  2   Living  1     SAB  2   TAB      Ectopic      Multiple      Live Births  1            Home Medications    Prior to Admission medications   Medication Sig Start Date End Date Taking? Authorizing Provider  metroNIDAZOLE (FLAGYL) 500 MG tablet Take 1 tablet (500 mg total) by mouth 2 (two) times daily. 11/03/18   Jacalyn LefevreHaviland, Julie, MD  promethazine (PHENERGAN)  12.5 MG tablet Take 1 tablet (12.5 mg total) by mouth every 6 (six) hours as needed for nausea or vomiting. 09/07/16   Judeth HornLawrence, Erin, NP    Family History Family History  Problem Relation Age of Onset  . Diabetes Maternal Grandmother   . Anesthesia problems Neg Hx   . Other Neg Hx     Social History Social History   Tobacco Use  . Smoking status: Never Smoker  . Smokeless tobacco: Never Used  Substance Use Topics  . Alcohol use: No  . Drug use: No     Allergies   Penicillins   Review of Systems Review of Systems  All other systems reviewed and are negative.    Physical Exam Updated Vital Signs BP 108/65 (BP Location: Right Arm)   Pulse 77   Temp 99.2 F (37.3 C) (Oral)   Resp 16   SpO2 100%   Physical Exam Vitals signs and nursing note reviewed.  Constitutional:      General: She is not in acute distress.    Appearance: She is well-developed.  HENT:     Head: Atraumatic.  Eyes:     Conjunctiva/sclera: Conjunctivae normal.  Neck:  Musculoskeletal: Neck supple.  Cardiovascular:     Rate and Rhythm: Normal rate and regular rhythm.  Pulmonary:     Effort: Pulmonary effort is normal.     Breath sounds: Normal breath sounds.  Chest:    Abdominal:     Palpations: Abdomen is soft.     Tenderness: There is no abdominal tenderness.  Skin:    Findings: No rash.  Neurological:     Mental Status: She is alert.      ED Treatments / Results  Labs (all labs ordered are listed, but only abnormal results are displayed) Labs Reviewed  CBC WITH DIFFERENTIAL/PLATELET - Abnormal; Notable for the following components:      Result Value   RDW 10.8 (*)    All other components within normal limits  BASIC METABOLIC PANEL  I-STAT CG4 LACTIC ACID, ED  POC URINE PREG, ED    EKG None  Radiology No results found.  Procedures Procedures (including critical care time)  Medications Ordered in ED Medications  morphine 4 MG/ML injection 4 mg (4 mg  Intravenous Given 11/14/18 0937)  clindamycin (CLEOCIN) IVPB 600 mg (0 mg Intravenous Stopped 11/14/18 1029)  ondansetron (ZOFRAN) injection 4 mg (4 mg Intravenous Given 11/14/18 0938)  sodium chloride 0.9 % bolus 1,000 mL (0 mLs Intravenous Stopped 11/14/18 1031)     Initial Impression / Assessment and Plan / ED Course  I have reviewed the triage vital signs and the nursing notes.  Pertinent labs & imaging results that were available during my care of the patient were reviewed by me and considered in my medical decision making (see chart for details).     BP 109/60 (BP Location: Left Arm)   Pulse 70   Temp 99.2 F (37.3 C) (Oral)   Resp 14   SpO2 100%    Final Clinical Impressions(s) / ED Diagnoses   Final diagnoses:  Abscess of right breast    ED Discharge Orders         Ordered    US BREAST ASPIRATION RIGHT     11/14/18 1004    traMADol (ULTRAM) 50 MG tablet  Every 6 hours PRN     11/14/18 1159         9:32 AM Patient here with progressive worsening right breast abscess not relief with p.o. antibiotic.  She did try to follow-up with the breast center but they did not accept her due to lack of PCP.  Given the complications of her infection, plan to consult general surgery for further management.  Patient initially on IV antibiotic, clindamycin.  Pain medication given.  12:02 PM I have consulted with general surgery, and spoke with PA Tresa EndoKelly, who mentioned that patient will need to follow-up with breast center for management of this breast abscess.  There are specific protocol in regards to imaging and drainage of the abscess as well as repeat reassessment if indicated and if all of the above failed and [facility] for [facility].  I specifically spoke with several staff from the breast center who was helpful in reaching out and look for any specific provider that can order the appropriate test however none of  willing or available.  At this time, I strongly encourage patient to follow-up at urgent care center to request appropriate testing and management to the breast center as they can follow-up appropriately.  Patient will benefit from a right breast ultrasound as well as breast aspiration as treatment  for this breast abscess.  Patient was given clindamycin IV antibiotic while in the ED.  Labs are reassuring.  Strict return precaution was given.   Fayrene Helper, PA-C 11/14/18 1205    Derwood Kaplan, MD 11/15/18 1137

## 2018-11-14 NOTE — ED Notes (Signed)
Patient verbalizes understanding of discharge instructions. Opportunity for questioning and answers were provided. Armband removed by staff, pt discharged from ED.  

## 2018-11-14 NOTE — Telephone Encounter (Signed)
Patient evaluated in the emergency room today for right breast abscess, recommended needing ultrasound-guided aspiration.  Patient was recommended to come to urgent care to have referral sent.  Referral to breast center sent.

## 2018-11-14 NOTE — Discharge Instructions (Signed)
You have a breast abscess.  Go to Urgent Care today and request referral to the breast center for aspiration.  Request for right breast ultrasound and aspiration.

## 2018-11-14 NOTE — ED Triage Notes (Signed)
Patient to ED c/o worsening pain and redness of abscess to R breast extending to R axilla. States she went to [facility] 2 weeks ago and was given antibiotics, but no relief. Low-grade fevers off and on.

## 2018-11-18 ENCOUNTER — Other Ambulatory Visit: Payer: Self-pay | Admitting: Emergency Medicine

## 2018-11-18 DIAGNOSIS — N611 Abscess of the breast and nipple: Secondary | ICD-10-CM

## 2018-11-18 DIAGNOSIS — N63 Unspecified lump in unspecified breast: Secondary | ICD-10-CM

## 2018-11-18 DIAGNOSIS — N644 Mastodynia: Secondary | ICD-10-CM

## 2018-11-19 ENCOUNTER — Other Ambulatory Visit: Payer: Self-pay | Admitting: Emergency Medicine

## 2018-11-20 ENCOUNTER — Other Ambulatory Visit: Payer: Medicaid Other

## 2018-11-20 ENCOUNTER — Other Ambulatory Visit: Payer: Self-pay | Admitting: Emergency Medicine

## 2018-11-20 ENCOUNTER — Ambulatory Visit
Admission: RE | Admit: 2018-11-20 | Discharge: 2018-11-20 | Disposition: A | Discharge status: Home / Self Care | Payer: Medicaid Other | Source: Ambulatory Visit | Attending: Emergency Medicine | Admitting: Emergency Medicine

## 2018-11-20 DIAGNOSIS — N63 Unspecified lump in unspecified breast: Secondary | ICD-10-CM

## 2018-11-20 DIAGNOSIS — N644 Mastodynia: Secondary | ICD-10-CM

## 2018-11-20 DIAGNOSIS — N611 Abscess of the breast and nipple: Secondary | ICD-10-CM

## 2018-11-26 ENCOUNTER — Other Ambulatory Visit: Payer: Self-pay | Admitting: Emergency Medicine

## 2018-12-05 ENCOUNTER — Other Ambulatory Visit: Payer: Medicaid Other

## 2020-10-24 ENCOUNTER — Emergency Department (HOSPITAL_COMMUNITY)
Admission: EM | Admit: 2020-10-24 | Discharge: 2020-10-24 | Disposition: A | Discharge status: Home / Self Care | Payer: Medicaid Other | Attending: Emergency Medicine | Admitting: Emergency Medicine

## 2020-10-24 ENCOUNTER — Other Ambulatory Visit: Payer: Self-pay

## 2020-10-24 DIAGNOSIS — K0889 Other specified disorders of teeth and supporting structures: Secondary | ICD-10-CM | POA: Diagnosis not present

## 2020-10-24 MED ORDER — OXYCODONE HCL 5 MG PO TABS: 5.0000 mg | ORAL_TABLET | ORAL | 0 refills | Status: DC | PRN

## 2020-10-24 NOTE — ED Triage Notes (Signed)
Pt presents to ED POV. Pt c/o R dental pain. Pt reports that she has appointment for tooth on wed but she cannot stand the pain. Pt has prescription for tramadol but it makes her sick. Airway intact

## 2020-10-24 NOTE — Discharge Instructions (Addendum)
You were evaluated in the Emergency Department and after careful evaluation, we did not find any emergent condition requiring admission or further testing in the hospital.  Your exam/testing today was overall reassuring.  Symptoms seem to be due to a tooth infection.  Continue taking the penicillin at home.  Keep your follow-up for tooth extraction.  Use Tylenol 1000 mg every 4-6 hours and Motrin 600 mg every 4-6 hours.  You can use the oxycodone tablets needed for more significant pain keeping you from sleeping.  Please return to the Emergency Department if you experience any worsening of your condition.  Thank you for allowing [place] to be a part of your care.

## 2020-10-29 NOTE — ED Provider Notes (Signed)
[facility]-EMERGENCY DEPT [facility] Emergency Department Provider Note MRN:  [id number]  Arrival date & time: 10/29/20     Chief Complaint   Dental Pain   History of Present Illness   Rachael Mann is a 30 y.o. year-old female with no pertinent past medical history presenting to the ED with chief complaint of dental.  Pain to the lower left teeth for the past few days, progressively worsening.  Denies fever, no other complaints.  Pain not well controlled at home, can wait for dental visit.  Review of Systems  A problem-focused ROS was performed. Positive for dental pain.  Patient denies fever.  Patient's Health History    Past Medical History:  Diagnosis Date  . Chlamydia   . History of miscarriage     Past Surgical History:  Procedure Laterality Date  . TONSILLECTOMY      Family History  Problem Relation Age of Onset  . Diabetes Maternal Grandmother   . Anesthesia problems Neg Hx   . Other Neg Hx     Social History   Socioeconomic History  . Marital status: Single    Spouse name: Not on file  . Number of children: Not on file  . Years of education: Not on file  . Highest education level: Not on file  Occupational History  . Not on file  Tobacco Use  . Smoking status: Never Smoker  . Smokeless tobacco: Never Used  Substance and Sexual Activity  . Alcohol use: No  . Drug use: No  . Sexual activity: Yes    Birth control/protection: None  Other Topics Concern  . Not on file  Social History Narrative  . Not on file   Social Determinants of Health   Financial Resource Strain: Not on file  Food Insecurity: Not on file  Transportation Needs: Not on file  Physical Activity: Not on file  Stress: Not on file  Social Connections: Not on file  Intimate Partner Violence: Not on file     Physical Exam   Vitals:   10/24/20 1550  BP: (!) 139/103  Pulse: (!) 113  Resp: 16  Temp: 98.4 F (36.9 C)  SpO2: 99%    CONSTITUTIONAL: Well-appearing,  NAD NEURO:  Alert and oriented x 3, no focal deficits EYES:  eyes equal and reactive ENT/NECK:  no LAD, no JVD CARDIO: Regular rate, well-perfused, normal S1 and S2 PULM:  CTAB no wheezing or rhonchi GI/GU:  normal bowel sounds, non-distended, non-tender MSK/SPINE:  No gross deformities, no edema SKIN:  no rash, atraumatic PSYCH:  Appropriate speech and behavior  *Additional and/or pertinent findings included in MDM below  Diagnostic and Interventional Summary    EKG Interpretation  Date/Time:    Ventricular Rate:    PR Interval:    QRS Duration:   QT Interval:    QTC Calculation:   R Axis:     Text Interpretation:        Labs Reviewed - No data to display  No orders to display    Medications - No data to display   Procedures  /  Critical Care Procedures  ED Course and Medical Decision Making  I have reviewed the triage vital signs, the nursing notes, and pertinent available records from the EMR.  Listed above are laboratory and imaging tests that I personally ordered, reviewed, and interpreted and then considered in my medical decision making (see below for details).  Reassuring exam with no signs of abscess or significant contiguous infection, favoring tooth  infection.  Patient has penicillin at home which she will take.  Providing further pain control at home, has dental follow-up.       Elmer Sow. Pilar Plate, MD [facility] Emergency Medicine [facility] [email or web address]  Final Clinical Impressions(s) / ED Diagnoses     ICD-10-CM   1. Pain, dental  K08.89     ED Discharge Orders         Ordered    oxyCODONE (ROXICODONE) 5 MG immediate release tablet  Every 4 hours PRN,   Status:  Discontinued        10/24/20 1809    oxyCODONE (ROXICODONE) 5 MG immediate release tablet  Every 4 hours PRN        10/24/20 1928           Discharge Instructions Discussed with and Provided to Patient:     Discharge Instructions     You were evaluated  in the Emergency Department and after careful evaluation, we did not find any emergent condition requiring admission or further testing in the hospital.  Your exam/testing today was overall reassuring.  Symptoms seem to be due to a tooth infection.  Continue taking the penicillin at home.  Keep your follow-up for tooth extraction.  Use Tylenol 1000 mg every 4-6 hours and Motrin 600 mg every 4-6 hours.  You can use the oxycodone tablets needed for more significant pain keeping you from sleeping.  Please return to the Emergency Department if you experience any worsening of your condition.  Thank you for allowing [place] to be a part of your care.       Sabas Sous, MD 10/29/20 [phone]

## 2021-08-11 ENCOUNTER — Other Ambulatory Visit: Payer: Self-pay

## 2021-08-11 DIAGNOSIS — G8929 Other chronic pain: Secondary | ICD-10-CM | POA: Diagnosis not present

## 2021-08-11 DIAGNOSIS — K0889 Other specified disorders of teeth and supporting structures: Secondary | ICD-10-CM | POA: Diagnosis present

## 2021-08-12 ENCOUNTER — Emergency Department (HOSPITAL_BASED_OUTPATIENT_CLINIC_OR_DEPARTMENT_OTHER)
Admission: EM | Admit: 2021-08-12 | Discharge: 2021-08-12 | Disposition: A | Discharge status: Home / Self Care | Payer: Medicaid Other | Attending: Emergency Medicine | Admitting: Emergency Medicine

## 2021-08-12 ENCOUNTER — Encounter (HOSPITAL_BASED_OUTPATIENT_CLINIC_OR_DEPARTMENT_OTHER): Payer: Self-pay

## 2021-08-12 DIAGNOSIS — G8929 Other chronic pain: Secondary | ICD-10-CM

## 2021-08-12 MED ORDER — HYDROCODONE-ACETAMINOPHEN 5-325 MG PO TABS: 1.0000 | ORAL_TABLET | ORAL | 0 refills | Status: DC | PRN

## 2021-08-12 NOTE — ED Triage Notes (Signed)
Patient arrives from home with upper left side dental pain. Patient having nasal and eye pain.

## 2021-08-12 NOTE — ED Provider Notes (Signed)
[facility] EMERGENCY DEPT Provider Note   CSN: [id number] Arrival date & time: 08/11/21  2337     History Chief Complaint  Patient presents with   Dental Pain    Rachael Mann is a 30 y.o. female.  The history is provided by the patient.  Dental Pain She has been having dental pain for about the last week.  She saw her dentist who started her on antibiotics 3 days ago.  Initially, her pain was on the right side, tonight it switched over to the left side and was radiating up into her cheek.  Pain is rated at 10/10.  She had been given a prescription for acetaminophen with codeine, but states that it gave her severe nausea when she was unable to take it.  She denies fever, chills, sweats.   Past Medical History:  Diagnosis Date   Chlamydia    History of miscarriage     There are no problems to display for this patient.   Past Surgical History:  Procedure Laterality Date   TONSILLECTOMY       OB History     Gravida  4   Para  1   Term  1   Preterm      AB  2   Living  1      SAB  2   IAB      Ectopic      Multiple      Live Births  1           Family History  Problem Relation Age of Onset   Diabetes Maternal Grandmother    Anesthesia problems Neg Hx    Other Neg Hx     Social History   Tobacco Use   Smoking status: Never   Smokeless tobacco: Never  Substance Use Topics   Alcohol use: No   Drug use: No    Home Medications Prior to Admission medications   Medication Sig Start Date End Date Taking? Authorizing Provider  metroNIDAZOLE (FLAGYL) 500 MG tablet Take 1 tablet (500 mg total) by mouth 2 (two) times daily. 11/03/18   Jacalyn Lefevre, MD  oxyCODONE (ROXICODONE) 5 MG immediate release tablet Take 1 tablet (5 mg total) by mouth every 4 (four) hours as needed for severe pain. 10/24/20   Sabas Sous, MD  promethazine (PHENERGAN) 12.5 MG tablet Take 1 tablet (12.5 mg total) by mouth every 6 (six) hours as needed  for nausea or vomiting. 09/07/16   Judeth Horn, NP    Allergies    Penicillins  Review of Systems   Review of Systems  All other systems reviewed and are negative.  Physical Exam Updated Vital Signs BP 115/70   Pulse (!) 57   Temp 98.1 F (36.7 C)   Resp 18   Ht 5\' 7"  (1.702 m)   LMP 08/12/2021   SpO2 100%   BMI 25.53 kg/m   Physical Exam Vitals and nursing note reviewed.  30 year old female, resting comfortably and in no acute distress. Vital signs are normal. Oxygen saturation is 100%, which is normal. Head is normocephalic and atraumatic. PERRLA, EOMI. Oropharynx is clear.  Examination of the teeth showed minimal caries.  There were chips of teeth number 14 and 15.  There is no gingival swelling, erythema, pallor.  Teeth numbers 14 and 15 were tender to percussion. Neck is nontender and supple without adenopathy or JVD. Back is nontender and there is no CVA tenderness. Lungs are clear  without rales, wheezes, or rhonchi. Chest is nontender. Heart has regular rate and rhythm without murmur. Abdomen is soft, flat, nontender . Extremities have no cyanosis or edema, full range of motion is present. Skin is warm and dry without rash. Neurologic: Mental status is normal, cranial nerves are intact, moves all extremities equally.  ED Results / Procedures / Treatments    Procedures Procedures   Medications Ordered in ED Medications - No data to display  ED Course  I have reviewed the triage vital signs and the nursing notes.  Dental pain with evidence of chipped teeth, but no serious dental caries identified.  Old records are reviewed, and she has a prior ED visit for dental pain.  She is already on antibiotics.  She is given a prescription for a small number of hydrocodone-acetaminophen tablets to take for severe pain.  She is to follow-up with her dentist for definitive dental care.  MDM Rules/Calculators/A&P  Final Clinical Impression(s) / ED Diagnoses Final  diagnoses:  Chronic dental pain    Rx / DC Orders ED Discharge Orders          Ordered    HYDROcodone-acetaminophen (NORCO) 5-325 MG tablet  Every 4 hours PRN        08/12/21 0432             Dione Booze, MD 08/12/21 [phone]

## 2022-02-05 ENCOUNTER — Other Ambulatory Visit: Payer: Self-pay

## 2022-02-05 ENCOUNTER — Encounter (HOSPITAL_BASED_OUTPATIENT_CLINIC_OR_DEPARTMENT_OTHER): Payer: Self-pay | Admitting: Emergency Medicine

## 2022-02-05 ENCOUNTER — Emergency Department (HOSPITAL_BASED_OUTPATIENT_CLINIC_OR_DEPARTMENT_OTHER): Payer: Medicaid Other

## 2022-02-05 ENCOUNTER — Emergency Department (HOSPITAL_BASED_OUTPATIENT_CLINIC_OR_DEPARTMENT_OTHER)
Admission: EM | Admit: 2022-02-05 | Discharge: 2022-02-05 | Disposition: A | Discharge status: Home / Self Care | Payer: Medicaid Other | Attending: Emergency Medicine | Admitting: Emergency Medicine

## 2022-02-05 DIAGNOSIS — M79632 Pain in left forearm: Secondary | ICD-10-CM | POA: Diagnosis not present

## 2022-02-05 DIAGNOSIS — M79641 Pain in right hand: Secondary | ICD-10-CM

## 2022-02-05 DIAGNOSIS — M25531 Pain in right wrist: Secondary | ICD-10-CM | POA: Diagnosis not present

## 2022-02-05 DIAGNOSIS — Y92410 Unspecified street and highway as the place of occurrence of the external cause: Secondary | ICD-10-CM | POA: Diagnosis not present

## 2022-02-05 DIAGNOSIS — M542 Cervicalgia: Secondary | ICD-10-CM | POA: Diagnosis not present

## 2022-02-05 DIAGNOSIS — S20219A Contusion of unspecified front wall of thorax, initial encounter: Secondary | ICD-10-CM | POA: Insufficient documentation

## 2022-02-05 DIAGNOSIS — M545 Low back pain, unspecified: Secondary | ICD-10-CM | POA: Diagnosis not present

## 2022-02-05 DIAGNOSIS — S8001XA Contusion of right knee, initial encounter: Secondary | ICD-10-CM | POA: Insufficient documentation

## 2022-02-05 DIAGNOSIS — S8002XA Contusion of left knee, initial encounter: Secondary | ICD-10-CM | POA: Diagnosis not present

## 2022-02-05 DIAGNOSIS — M25561 Pain in right knee: Secondary | ICD-10-CM

## 2022-02-05 DIAGNOSIS — S8991XA Unspecified injury of right lower leg, initial encounter: Secondary | ICD-10-CM | POA: Diagnosis present

## 2022-02-05 DIAGNOSIS — M25562 Pain in left knee: Secondary | ICD-10-CM

## 2022-02-05 LAB — PREGNANCY, URINE: Preg Test, Ur: NEGATIVE

## 2022-02-05 MED ORDER — LIDOCAINE 4 % EX PTCH: 1.0000 | MEDICATED_PATCH | Freq: Two times a day (BID) | CUTANEOUS | 0 refills | Status: AC | PRN

## 2022-02-05 MED ORDER — LIDOCAINE 5 % EX PTCH
1.0000 | MEDICATED_PATCH | CUTANEOUS | Status: DC
  Administered 2022-02-05: 1 via TRANSDERMAL
  Filled 2022-02-05: qty 1

## 2022-02-05 MED ORDER — METHOCARBAMOL 500 MG PO TABS: 500.0000 mg | ORAL_TABLET | Freq: Three times a day (TID) | ORAL | 0 refills | Status: AC | PRN
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
## 2022-02-05 NOTE — ED Notes (Signed)
Pt denies taking anything for pain PTA.  ?

## 2022-02-05 NOTE — ED Notes (Signed)
ED Provider at bedside. 
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
